# Patient Record
Sex: Female | Born: 1955
Health system: Southern US, Community
[De-identification: ages and names within clinical notes are randomized; demographics above are authoritative.]

## PROBLEM LIST (undated history)

## (undated) DIAGNOSIS — M1990 Unspecified osteoarthritis, unspecified site: Secondary | ICD-10-CM

## (undated) DIAGNOSIS — J189 Pneumonia, unspecified organism: Secondary | ICD-10-CM

## (undated) DIAGNOSIS — C50919 Malignant neoplasm of unspecified site of unspecified female breast: Secondary | ICD-10-CM

## (undated) DIAGNOSIS — I10 Essential (primary) hypertension: Secondary | ICD-10-CM

## (undated) DIAGNOSIS — I8001 Phlebitis and thrombophlebitis of superficial vessels of right lower extremity: Secondary | ICD-10-CM

## (undated) DIAGNOSIS — I872 Venous insufficiency (chronic) (peripheral): Secondary | ICD-10-CM

## (undated) DIAGNOSIS — I82409 Acute embolism and thrombosis of unspecified deep veins of unspecified lower extremity: Secondary | ICD-10-CM

## (undated) HISTORY — PX: OTHER SURGICAL HISTORY: SHX169

## (undated) HISTORY — PX: APPENDECTOMY: SHX54

## (undated) HISTORY — PX: MANDIBLE FRACTURE SURGERY: SHX706

---

## 1998-10-02 ENCOUNTER — Other Ambulatory Visit: Admission: RE | Admit: 1998-10-02 | Discharge: 1998-10-02 | Payer: Self-pay | Admitting: Obstetrics & Gynecology

## 2000-04-25 ENCOUNTER — Encounter (INDEPENDENT_AMBULATORY_CARE_PROVIDER_SITE_OTHER): Payer: Self-pay | Admitting: Specialist

## 2000-04-25 ENCOUNTER — Encounter: Payer: Self-pay | Admitting: *Deleted

## 2000-04-25 ENCOUNTER — Ambulatory Visit (HOSPITAL_COMMUNITY): Admission: RE | Admit: 2000-04-25 | Discharge: 2000-04-26 | Payer: Self-pay | Admitting: *Deleted

## 2000-04-26 ENCOUNTER — Observation Stay (HOSPITAL_COMMUNITY): Admission: EM | Admit: 2000-04-26 | Discharge: 2000-04-27 | Payer: Self-pay | Admitting: Emergency Medicine

## 2000-04-26 ENCOUNTER — Encounter: Payer: Self-pay | Admitting: Family Medicine

## 2002-06-11 ENCOUNTER — Encounter (INDEPENDENT_AMBULATORY_CARE_PROVIDER_SITE_OTHER): Payer: Self-pay | Admitting: Specialist

## 2002-06-11 ENCOUNTER — Encounter: Payer: Self-pay | Admitting: Obstetrics & Gynecology

## 2002-06-11 ENCOUNTER — Encounter: Admission: RE | Admit: 2002-06-11 | Discharge: 2002-06-11 | Payer: Self-pay | Admitting: Obstetrics & Gynecology

## 2002-06-13 ENCOUNTER — Ambulatory Visit (HOSPITAL_COMMUNITY): Admission: RE | Admit: 2002-06-13 | Discharge: 2002-06-13 | Payer: Self-pay | Admitting: Surgery

## 2002-06-13 ENCOUNTER — Encounter: Payer: Self-pay | Admitting: Surgery

## 2002-06-13 ENCOUNTER — Other Ambulatory Visit: Admission: RE | Admit: 2002-06-13 | Discharge: 2002-06-13 | Payer: Self-pay | Admitting: Obstetrics and Gynecology

## 2002-06-17 ENCOUNTER — Ambulatory Visit (HOSPITAL_COMMUNITY): Admission: RE | Admit: 2002-06-17 | Discharge: 2002-06-17 | Payer: Self-pay | Admitting: Surgery

## 2002-06-17 ENCOUNTER — Encounter: Payer: Self-pay | Admitting: Surgery

## 2002-06-19 ENCOUNTER — Encounter: Admission: RE | Admit: 2002-06-19 | Discharge: 2002-06-19 | Payer: Self-pay | Admitting: Surgery

## 2002-06-19 ENCOUNTER — Encounter: Payer: Self-pay | Admitting: Surgery

## 2002-06-21 ENCOUNTER — Encounter: Payer: Self-pay | Admitting: Surgery

## 2002-06-21 ENCOUNTER — Ambulatory Visit (HOSPITAL_BASED_OUTPATIENT_CLINIC_OR_DEPARTMENT_OTHER): Admission: RE | Admit: 2002-06-21 | Discharge: 2002-06-21 | Payer: Self-pay | Admitting: Surgery

## 2002-06-21 ENCOUNTER — Encounter: Admission: RE | Admit: 2002-06-21 | Discharge: 2002-06-21 | Payer: Self-pay | Admitting: Surgery

## 2002-06-21 ENCOUNTER — Encounter (INDEPENDENT_AMBULATORY_CARE_PROVIDER_SITE_OTHER): Payer: Self-pay | Admitting: *Deleted

## 2002-07-03 ENCOUNTER — Ambulatory Visit: Admission: RE | Admit: 2002-07-03 | Discharge: 2002-07-22 | Payer: Self-pay | Admitting: Radiation Oncology

## 2002-07-22 ENCOUNTER — Ambulatory Visit: Admission: RE | Admit: 2002-07-22 | Discharge: 2002-09-30 | Payer: Self-pay | Admitting: Radiation Oncology

## 2003-06-05 ENCOUNTER — Ambulatory Visit: Admission: RE | Admit: 2003-06-05 | Discharge: 2003-06-05 | Payer: Self-pay | Admitting: Radiation Oncology

## 2003-06-10 ENCOUNTER — Encounter: Admission: RE | Admit: 2003-06-10 | Discharge: 2003-06-10 | Payer: Self-pay | Admitting: Obstetrics & Gynecology

## 2003-06-12 ENCOUNTER — Ambulatory Visit: Admission: RE | Admit: 2003-06-12 | Discharge: 2003-06-12 | Payer: Self-pay | Admitting: Radiation Oncology

## 2004-11-01 ENCOUNTER — Encounter: Admission: RE | Admit: 2004-11-01 | Discharge: 2004-11-01 | Payer: Self-pay | Admitting: Surgery

## 2004-11-22 ENCOUNTER — Encounter: Admission: RE | Admit: 2004-11-22 | Discharge: 2004-11-22 | Payer: Self-pay | Admitting: Surgery

## 2005-05-16 ENCOUNTER — Other Ambulatory Visit: Admission: RE | Admit: 2005-05-16 | Discharge: 2005-05-16 | Payer: Self-pay | Admitting: Obstetrics & Gynecology

## 2006-02-07 ENCOUNTER — Encounter: Admission: RE | Admit: 2006-02-07 | Discharge: 2006-02-07 | Payer: Self-pay | Admitting: Family Medicine

## 2007-03-13 ENCOUNTER — Encounter: Admission: RE | Admit: 2007-03-13 | Discharge: 2007-03-13 | Payer: Self-pay | Admitting: Family Medicine

## 2008-08-30 ENCOUNTER — Emergency Department (HOSPITAL_COMMUNITY): Admission: EM | Admit: 2008-08-30 | Discharge: 2008-08-30 | Payer: Self-pay | Admitting: Emergency Medicine

## 2009-04-15 ENCOUNTER — Encounter: Admission: RE | Admit: 2009-04-15 | Discharge: 2009-04-15 | Payer: Self-pay | Admitting: Infectious Diseases

## 2009-10-01 ENCOUNTER — Emergency Department (HOSPITAL_COMMUNITY): Admission: EM | Admit: 2009-10-01 | Discharge: 2009-10-01 | Payer: Self-pay | Admitting: Emergency Medicine

## 2009-10-13 ENCOUNTER — Emergency Department (HOSPITAL_COMMUNITY): Admission: EM | Admit: 2009-10-13 | Discharge: 2009-10-14 | Payer: Self-pay | Admitting: Emergency Medicine

## 2010-01-01 ENCOUNTER — Ambulatory Visit (HOSPITAL_COMMUNITY): Admission: RE | Admit: 2010-01-01 | Discharge: 2010-01-01 | Payer: Self-pay | Admitting: Radiation Oncology

## 2010-05-30 ENCOUNTER — Encounter: Payer: Self-pay | Admitting: *Deleted

## 2010-05-30 ENCOUNTER — Encounter: Payer: Self-pay | Admitting: Surgery

## 2010-07-26 LAB — DIFFERENTIAL
Band Neutrophils: 0 % (ref 0–10)
Basophils Absolute: 0 10*3/uL (ref 0.0–0.1)
Basophils Relative: 0 % (ref 0–1)
Blasts: 0 %
Eosinophils Absolute: 0.3 10*3/uL (ref 0.0–0.7)
Eosinophils Relative: 2 % (ref 0–5)
Lymphocytes Relative: 43 % (ref 12–46)
Lymphs Abs: 5.8 10*3/uL — ABNORMAL HIGH (ref 0.7–4.0)
Metamyelocytes Relative: 0 %
Monocytes Absolute: 1.2 10*3/uL — ABNORMAL HIGH (ref 0.1–1.0)
Monocytes Relative: 9 % (ref 3–12)
Myelocytes: 0 %
Neutro Abs: 6.1 10*3/uL (ref 1.7–7.7)
Neutrophils Relative %: 46 % (ref 43–77)
Promyelocytes Absolute: 0 %
WBC Morphology: INCREASED
nRBC: 0 /100 WBC

## 2010-07-26 LAB — URINALYSIS, ROUTINE W REFLEX MICROSCOPIC
Glucose, UA: NEGATIVE mg/dL
Hgb urine dipstick: NEGATIVE
Ketones, ur: 40 mg/dL — AB
Nitrite: NEGATIVE
Protein, ur: 100 mg/dL — AB
Specific Gravity, Urine: 1.031 — ABNORMAL HIGH (ref 1.005–1.030)
Urobilinogen, UA: 1 mg/dL (ref 0.0–1.0)
pH: 6 (ref 5.0–8.0)

## 2010-07-26 LAB — POCT I-STAT, CHEM 8
BUN: 13 mg/dL (ref 6–23)
Calcium, Ion: 1.06 mmol/L — ABNORMAL LOW (ref 1.12–1.32)
Chloride: 103 mEq/L (ref 96–112)
Creatinine, Ser: 0.9 mg/dL (ref 0.4–1.2)
Glucose, Bld: 100 mg/dL — ABNORMAL HIGH (ref 70–99)
HCT: 39 % (ref 36.0–46.0)
Hemoglobin: 13.3 g/dL (ref 12.0–15.0)
Potassium: 4.4 mEq/L (ref 3.5–5.1)
Sodium: 136 mEq/L (ref 135–145)
TCO2: 28 mmol/L (ref 0–100)

## 2010-07-26 LAB — LIPASE, BLOOD: Lipase: 15 U/L (ref 11–59)

## 2010-07-26 LAB — URINE MICROSCOPIC-ADD ON

## 2010-07-26 LAB — COMPREHENSIVE METABOLIC PANEL
ALT: 23 U/L (ref 0–35)
AST: 32 U/L (ref 0–37)
Albumin: 3.2 g/dL — ABNORMAL LOW (ref 3.5–5.2)
Alkaline Phosphatase: 82 U/L (ref 39–117)
BUN: 10 mg/dL (ref 6–23)
CO2: 23 mEq/L (ref 19–32)
Calcium: 8.3 mg/dL — ABNORMAL LOW (ref 8.4–10.5)
Chloride: 94 mEq/L — ABNORMAL LOW (ref 96–112)
Creatinine, Ser: 1.01 mg/dL (ref 0.4–1.2)
GFR calc Af Amer: >60 mL/min (ref >60)
GFR calc non Af Amer: 57 mL/min — ABNORMAL LOW (ref >60)
Glucose, Bld: 127 mg/dL — ABNORMAL HIGH (ref 70–99)
Potassium: 3.4 mEq/L — ABNORMAL LOW (ref 3.5–5.1)
Sodium: 126 mEq/L — ABNORMAL LOW (ref 135–145)
Total Bilirubin: 0.9 mg/dL (ref 0.3–1.2)
Total Protein: 6.8 g/dL (ref 6.0–8.3)

## 2010-07-26 LAB — CBC
HCT: 37.4 % (ref 36.0–46.0)
Hemoglobin: 13.1 g/dL (ref 12.0–15.0)
MCHC: 35 g/dL (ref 30.0–36.0)
MCV: 89.5 fL (ref 78.0–100.0)
Platelets: 193 10*3/uL (ref 150–400)
RBC: 4.18 MIL/uL (ref 3.87–5.11)
RDW: 14.4 % (ref 11.5–15.5)
WBC: 13.4 10*3/uL — ABNORMAL HIGH (ref 4.0–10.5)

## 2010-07-26 LAB — CULTURE, BLOOD (ROUTINE X 2)
Culture: NO GROWTH
Culture: NO GROWTH

## 2010-07-26 LAB — D-DIMER, QUANTITATIVE: D-Dimer, Quant: 1.1 ug/mL-FEU — ABNORMAL HIGH (ref 0.00–0.48)

## 2010-09-24 NOTE — Op Note (Signed)
NAME:  Virginia Morgan, Virginia Morgan                          ACCOUNT NO.:  [id number]   MEDICAL RECORD NO.:  [id number]                   PATIENT TYPE:  AMB   LOCATION:  DSC                                  FACILITY:  [facility]   PHYSICIAN:  Currie Paris, M.D.           DATE OF BIRTH:  1955/11/23   DATE OF PROCEDURE:  06/21/2002  DATE OF DISCHARGE:                                 OPERATIVE REPORT   OFFICE MEDICAL RECORD NUMBER:  [id number]   PREOPERATIVE DIAGNOSIS:  Ductal carcinoma in situ, left breast, upper-inner  quadrant.   POSTOPERATIVE DIAGNOSIS:  Ductal carcinoma in situ, left breast, upper-inner  quadrant.   OPERATION:  Needle-guided excision of left breast cancer.   SURGEON:  Currie Paris, M.D.   ANESTHESIA:  General.   CLINICAL HISTORY:  This patient is a generally healthy 55 year old who has  recently had a mammogram showing an area of calcifications.  Core biopsy has  shown DCIS.  Workup otherwise was negative.   DESCRIPTION OF PROCEDURE:  The patient was seen in the holding area and had  no further questions.  She was taken into the operating room and after  satisfactory general anesthesia had been obtained, the left breast was  prepped and draped.  The guidewire entered medially and tracked fairly  directly posteriorly but a little bit from medial to lateral.   I made a direct transverse incision, since the guidewire was located almost  in the 9 o'clock position.  I divided just a little bit of subcutaneous  tissue and then took a wide excision around the guidewire using cautery.  Most of this tissue just appeared to be fatty tissue.  I felt I was  completely around the guidewire and sent that for specimen mammography.  However, I could not really tell where my margins were in relation to any of  this, so I went ahead and  used the cautery at this point and took another  centimeter of margin circumferentially around the biopsy cavity.  Once this  was done, I  marked out for orientation and then proceeded to close the  incision.  Prior to closing, we made sure everything was dry; I used the  cautery for that.  I injected Marcaine in the skin at the start and some  into the breast tissues as we proceeded and a little bit left in the cavity  at the end to help with postoperative pain relief.  I put four small clips  to mark the periphery of the incision of the lumpectomy and a larger clip at  the deep and superficial margins of the lumpectomy site.   There really was not much of a cavity here and because of so much fatty  tissue, it tended to fall together, but I did use a few Vicryls to  reapproximate the dead space and I then closed the skin with 4-0 Monocryl  subcuticular plus Steri-Strips.  The patient tolerated the procedure well.  There were no operative complications and all counts were correct.                                               Currie Paris, M.D.    CJS/MEDQ  D:  06/21/2002  T:  06/21/2002  Job:  [id number]   cc:   Freddy Finner, M.D.  [address] [place]  [place] [address]  Fax: [phone]

## 2010-09-24 NOTE — Op Note (Signed)
[facility]  Patient:    Virginia Morgan, Virginia Morgan                       MRN: [id number] Proc. Date: 04/26/00 Adm. Date:  [id number] Disc. Date: [id number] Attending:  Meredith Leeds                           Operative Report  PREOPERATIVE DIAGNOSIS:  Acute appendicitis.  POSTOPERATIVE DIAGNOSIS:  Acute appendicitis.  OPERATION:  Laparoscopic appendectomy.  SURGEON:  Zigmund Daniel, M.D.  ANESTHESIA: General.  DESCRIPTION OF PROCEDURE:  After adequate monitoring and anesthesia and insertion of a Foley catheter and  routine preparation and draping of the abdomen, I made a short transverse infraumbilical incision, cutting out a soft skin tag which was in the way.  I dissected down through the fat, to the midline fascia and opened it in the midline, then bluntly opened the peritoneum and assured good access to the abdominal cavity.  After placement of a 0 Vicryl pursestring and securing a Hasson cannula, I inflated the abdomen with CO2 and inserted the scope.  There was inflammation at the tip of the cecum.  There were adhesions of the distal ileum and cecum to the lateral pelvic and abdominal wall, making exposure of the appendix difficult.  The patients large amount of abdominal fat also made the exposure somewhat difficult.  I cut adhesions using cautery and scissors and was able to then grasp the appendix near the tip.  It was more or less inflamed throughout, and the mesentery was quite thick.  I dissected the mesentery, and in the dissection felt that I encountered and cauterized the appendiceal artery without every seeing it specifically.  However, I dissected down until there was really very little tissue left in the mesentery, and the appendix was dissected down to the base of the cecum, and it looked healthy at that point. I stapled it with a endoscopic stapler 35 mm in height, and it appeared to provide secure closure and amputation.  I then  placed the appendix in a plastic pouch.  I thoroughly inspected the operative area and found that there were no signs of bleeding.  I irrigated the area and removed the irrigant.   I had not seen any significant free fluid or pus at the time of exploration.  I removed the appendix from the body through the umbilical incision and then tied the pursestring suture under direct vision.  I anesthetized all the incisions and removed the other ports after removing the CO2.  I closed the skin of all incisions with intracuticular 4-0 Vicryl and Steri-Strips.  The patient tolerated the operation well. DD:  04/26/00 TD:  04/27/00 Job: [id number]

## 2011-02-25 ENCOUNTER — Other Ambulatory Visit (HOSPITAL_COMMUNITY): Payer: Self-pay | Admitting: Obstetrics & Gynecology

## 2011-02-25 DIAGNOSIS — Z1231 Encounter for screening mammogram for malignant neoplasm of breast: Secondary | ICD-10-CM

## 2011-03-21 ENCOUNTER — Ambulatory Visit (HOSPITAL_COMMUNITY)
Admission: RE | Admit: 2011-03-21 | Discharge: 2011-03-21 | Disposition: A | Discharge status: Home / Self Care | Payer: Self-pay | Source: Ambulatory Visit | Attending: Obstetrics & Gynecology | Admitting: Obstetrics & Gynecology

## 2011-03-21 DIAGNOSIS — Z1231 Encounter for screening mammogram for malignant neoplasm of breast: Secondary | ICD-10-CM

## 2011-12-27 ENCOUNTER — Emergency Department (HOSPITAL_COMMUNITY): Payer: Self-pay

## 2011-12-27 ENCOUNTER — Encounter (HOSPITAL_COMMUNITY): Payer: Self-pay | Admitting: *Deleted

## 2011-12-27 ENCOUNTER — Inpatient Hospital Stay (HOSPITAL_COMMUNITY)
Admission: EM | Admit: 2011-12-27 | Discharge: 2011-12-28 | DRG: 202 | Disposition: A | Discharge status: Home / Self Care | Payer: MEDICAID | Attending: Internal Medicine | Admitting: Internal Medicine

## 2011-12-27 DIAGNOSIS — R0902 Hypoxemia: Secondary | ICD-10-CM | POA: Diagnosis present

## 2011-12-27 DIAGNOSIS — Z6841 Body Mass Index (BMI) 40.0 and over, adult: Secondary | ICD-10-CM

## 2011-12-27 DIAGNOSIS — R0602 Shortness of breath: Secondary | ICD-10-CM

## 2011-12-27 DIAGNOSIS — M79609 Pain in unspecified limb: Secondary | ICD-10-CM

## 2011-12-27 DIAGNOSIS — Z853 Personal history of malignant neoplasm of breast: Secondary | ICD-10-CM

## 2011-12-27 DIAGNOSIS — J209 Acute bronchitis, unspecified: Principal | ICD-10-CM | POA: Diagnosis present

## 2011-12-27 DIAGNOSIS — R609 Edema, unspecified: Secondary | ICD-10-CM | POA: Diagnosis present

## 2011-12-27 DIAGNOSIS — R0600 Dyspnea, unspecified: Secondary | ICD-10-CM | POA: Diagnosis present

## 2011-12-27 DIAGNOSIS — R000 Tachycardia, unspecified: Secondary | ICD-10-CM | POA: Diagnosis present

## 2011-12-27 HISTORY — DX: Pneumonia, unspecified organism: J18.9

## 2011-12-27 HISTORY — DX: Malignant neoplasm of unspecified site of unspecified female breast: C50.919

## 2011-12-27 LAB — BLOOD GAS, ARTERIAL
Acid-base deficit: 1 mmol/L (ref 0.0–2.0)
Bicarbonate: 22.4 mEq/L (ref 20.0–24.0)
Drawn by: 103701
FIO2: 0.21 %
O2 Saturation: 94.9 %
Patient temperature: 98.6
TCO2: 19.8 mmol/L (ref 0–100)
pCO2 arterial: 34.9 mmHg — ABNORMAL LOW (ref 35.0–45.0)
pH, Arterial: 7.423 (ref 7.350–7.450)
pO2, Arterial: 71.7 mmHg — ABNORMAL LOW (ref 80.0–100.0)

## 2011-12-27 LAB — CREATININE, SERUM
Creatinine, Ser: 0.77 mg/dL (ref 0.50–1.10)
GFR calc Af Amer: >90 mL/min (ref >90)
GFR calc non Af Amer: >90 mL/min (ref >90)

## 2011-12-27 LAB — BASIC METABOLIC PANEL
BUN: 10 mg/dL (ref 6–23)
CO2: 27 mEq/L (ref 19–32)
Calcium: 9.1 mg/dL (ref 8.4–10.5)
Chloride: 103 mEq/L (ref 96–112)
Creatinine, Ser: 0.75 mg/dL (ref 0.50–1.10)
GFR calc Af Amer: >90 mL/min (ref >90)
GFR calc non Af Amer: >90 mL/min (ref >90)
Glucose, Bld: 115 mg/dL — ABNORMAL HIGH (ref 70–99)
Potassium: 4 mEq/L (ref 3.5–5.1)
Sodium: 136 mEq/L (ref 135–145)

## 2011-12-27 LAB — CBC
HCT: 37.7 % (ref 36.0–46.0)
HCT: 39.8 % (ref 36.0–46.0)
Hemoglobin: 12.1 g/dL (ref 12.0–15.0)
Hemoglobin: 12.7 g/dL (ref 12.0–15.0)
MCH: 29.8 pg (ref 26.0–34.0)
MCH: 29.7 pg (ref 26.0–34.0)
MCHC: 32.1 g/dL (ref 30.0–36.0)
MCHC: 31.9 g/dL (ref 30.0–36.0)
MCV: 92.9 fL (ref 78.0–100.0)
MCV: 93 fL (ref 78.0–100.0)
Platelets: 243 10*3/uL (ref 150–400)
Platelets: 266 10*3/uL (ref 150–400)
RBC: 4.06 MIL/uL (ref 3.87–5.11)
RBC: 4.28 MIL/uL (ref 3.87–5.11)
RDW: 13.8 % (ref 11.5–15.5)
RDW: 14.1 % (ref 11.5–15.5)
WBC: 7.5 10*3/uL (ref 4.0–10.5)
WBC: 8.2 10*3/uL (ref 4.0–10.5)

## 2011-12-27 LAB — HEPATIC FUNCTION PANEL
ALT: 14 U/L (ref 0–35)
AST: 19 U/L (ref 0–37)
Albumin: 3.8 g/dL (ref 3.5–5.2)
Alkaline Phosphatase: 77 U/L (ref 39–117)
Bilirubin, Direct: <0.1 mg/dL (ref 0.0–0.3)
Total Bilirubin: 0.3 mg/dL (ref 0.3–1.2)
Total Protein: 7.1 g/dL (ref 6.0–8.3)

## 2011-12-27 LAB — CARDIAC PANEL(CRET KIN+CKTOT+MB+TROPI)
CK, MB: 4.1 ng/mL — ABNORMAL HIGH (ref 0.3–4.0)
CK, MB: 5.3 ng/mL — ABNORMAL HIGH (ref 0.3–4.0)
Relative Index: 2.2 (ref 0.0–2.5)
Relative Index: 2.2 (ref 0.0–2.5)
Total CK: 183 U/L — ABNORMAL HIGH (ref 7–177)
Total CK: 246 U/L — ABNORMAL HIGH (ref 7–177)
Troponin I: <0.3 ng/mL (ref <0.30)
Troponin I: <0.3 ng/mL (ref <0.30)

## 2011-12-27 LAB — HEMOGLOBIN A1C
Hgb A1c MFr Bld: 5.4 % (ref <5.7)
Mean Plasma Glucose: 108 mg/dL (ref <117)

## 2011-12-27 LAB — D-DIMER, QUANTITATIVE: D-Dimer, Quant: 0.79 ug/mL-FEU — ABNORMAL HIGH (ref 0.00–0.48)

## 2011-12-27 LAB — TSH: TSH: 1.688 u[IU]/mL (ref 0.350–4.500)

## 2011-12-27 LAB — MAGNESIUM: Magnesium: 2 mg/dL (ref 1.5–2.5)

## 2011-12-27 LAB — PRO B NATRIURETIC PEPTIDE: Pro B Natriuretic peptide (BNP): 44.5 pg/mL (ref 0–125)

## 2011-12-27 MED ORDER — SODIUM CHLORIDE 0.9 % IJ SOLN: 3.0000 mL | INTRAMUSCULAR | Status: DC | PRN

## 2011-12-27 MED ORDER — SODIUM CHLORIDE 0.9 % IV SOLN: INTRAVENOUS | Status: DC

## 2011-12-27 MED ORDER — SODIUM CHLORIDE 0.9 % IJ SOLN
3.0000 mL | Freq: Two times a day (BID) | INTRAMUSCULAR | Status: DC
  Administered 2011-12-27: 3 mL via INTRAVENOUS

## 2011-12-27 MED ORDER — ONDANSETRON HCL 4 MG/2ML IJ SOLN: 4.0000 mg | Freq: Four times a day (QID) | INTRAMUSCULAR | Status: DC | PRN

## 2011-12-27 MED ORDER — SODIUM CHLORIDE 0.9 % IJ SOLN
3.0000 mL | Freq: Two times a day (BID) | INTRAMUSCULAR | Status: DC
  Administered 2011-12-27 – 2011-12-28 (×2): 3 mL via INTRAVENOUS

## 2011-12-27 MED ORDER — ACETAMINOPHEN 650 MG RE SUPP: 650.0000 mg | Freq: Four times a day (QID) | RECTAL | Status: DC | PRN

## 2011-12-27 MED ORDER — IPRATROPIUM BROMIDE 0.02 % IN SOLN
0.5000 mg | Freq: Once | RESPIRATORY_TRACT | Status: AC
  Administered 2011-12-27: 0.5 mg via RESPIRATORY_TRACT
  Filled 2011-12-27: qty 2.5

## 2011-12-27 MED ORDER — ACETAMINOPHEN 325 MG PO TABS
650.0000 mg | ORAL_TABLET | Freq: Four times a day (QID) | ORAL | Status: DC | PRN
  Administered 2011-12-27: 650 mg via ORAL
  Filled 2011-12-27: qty 2

## 2011-12-27 MED ORDER — METHYLPREDNISOLONE SODIUM SUCC 125 MG IJ SOLR
125.0000 mg | Freq: Once | INTRAMUSCULAR | Status: AC
  Administered 2011-12-27: 125 mg via INTRAVENOUS
  Filled 2011-12-27: qty 2

## 2011-12-27 MED ORDER — ONDANSETRON HCL 4 MG PO TABS: 4.0000 mg | ORAL_TABLET | Freq: Four times a day (QID) | ORAL | Status: DC | PRN

## 2011-12-27 MED ORDER — LORAZEPAM 2 MG/ML IJ SOLN
1.0000 mg | Freq: Once | INTRAMUSCULAR | Status: AC
  Administered 2011-12-27: 10:00:00 via INTRAVENOUS
  Filled 2011-12-27: qty 1

## 2011-12-27 MED ORDER — IBUPROFEN 600 MG PO TABS
600.0000 mg | ORAL_TABLET | Freq: Three times a day (TID) | ORAL | Status: DC | PRN
  Administered 2011-12-28: 600 mg via ORAL
  Filled 2011-12-27: qty 1

## 2011-12-27 MED ORDER — SODIUM CHLORIDE 0.9 % IV SOLN: 250.0000 mL | INTRAVENOUS | Status: DC | PRN

## 2011-12-27 MED ORDER — ALBUTEROL SULFATE (5 MG/ML) 0.5% IN NEBU
5.0000 mg | INHALATION_SOLUTION | Freq: Once | RESPIRATORY_TRACT | Status: AC
  Administered 2011-12-27: 5 mg via RESPIRATORY_TRACT
  Filled 2011-12-27: qty 1

## 2011-12-27 MED ORDER — ALBUTEROL SULFATE (5 MG/ML) 0.5% IN NEBU: 2.5000 mg | INHALATION_SOLUTION | RESPIRATORY_TRACT | Status: DC

## 2011-12-27 MED ORDER — CITALOPRAM HYDROBROMIDE 40 MG PO TABS
40.0000 mg | ORAL_TABLET | Freq: Every day | ORAL | Status: DC
  Administered 2011-12-28: 40 mg via ORAL
  Filled 2011-12-27 (×2): qty 1

## 2011-12-27 MED ORDER — ASPIRIN EC 81 MG PO TBEC
81.0000 mg | DELAYED_RELEASE_TABLET | Freq: Every day | ORAL | Status: DC
  Administered 2011-12-28: 81 mg via ORAL
  Filled 2011-12-27 (×2): qty 1

## 2011-12-27 MED ORDER — ENOXAPARIN SODIUM 40 MG/0.4ML ~~LOC~~ SOLN
40.0000 mg | SUBCUTANEOUS | Status: DC
  Administered 2011-12-27: 40 mg via SUBCUTANEOUS
  Filled 2011-12-27 (×2): qty 0.4

## 2011-12-27 MED ORDER — LEVALBUTEROL HCL 0.63 MG/3ML IN NEBU
0.6300 mg | INHALATION_SOLUTION | Freq: Four times a day (QID) | RESPIRATORY_TRACT | Status: DC
Start: 2011-12-27 — End: 2011-12-28
  Administered 2011-12-27 (×2): 0.63 mg via RESPIRATORY_TRACT
  Filled 2011-12-27 (×4): qty 3

## 2011-12-27 MED ORDER — IPRATROPIUM BROMIDE 0.02 % IN SOLN
0.5000 mg | Freq: Four times a day (QID) | RESPIRATORY_TRACT | Status: DC
  Administered 2011-12-27 (×2): 0.5 mg via RESPIRATORY_TRACT
  Filled 2011-12-27 (×2): qty 2.5

## 2011-12-27 MED ORDER — IOHEXOL 300 MG/ML  SOLN
100.0000 mL | Freq: Once | INTRAMUSCULAR | Status: AC | PRN
  Administered 2011-12-27: 100 mL via INTRAVENOUS

## 2011-12-27 MED ORDER — BUPROPION HCL ER (XL) 300 MG PO TB24
300.0000 mg | ORAL_TABLET | Freq: Every day | ORAL | Status: DC
  Administered 2011-12-28: 300 mg via ORAL
  Filled 2011-12-27 (×2): qty 1

## 2011-12-27 MED ORDER — METHYLPREDNISOLONE SODIUM SUCC 125 MG IJ SOLR
60.0000 mg | Freq: Two times a day (BID) | INTRAMUSCULAR | Status: DC
  Administered 2011-12-27 – 2011-12-28 (×2): 60 mg via INTRAVENOUS
  Filled 2011-12-27 (×3): qty 0.96

## 2011-12-27 MED ORDER — PREDNISONE 20 MG PO TABS
60.0000 mg | ORAL_TABLET | Freq: Once | ORAL | Status: AC
  Administered 2011-12-27: 60 mg via ORAL
  Filled 2011-12-27: qty 3

## 2011-12-27 MED ORDER — ALBUTEROL (5 MG/ML) CONTINUOUS INHALATION SOLN
10.0000 mg/h | INHALATION_SOLUTION | RESPIRATORY_TRACT | Status: AC
  Administered 2011-12-27: 10 mg/h via RESPIRATORY_TRACT

## 2011-12-27 NOTE — ED Notes (Signed)
Unable to obtain blood for labs due to patient having venous duplex.will obtain labs when duplex complete

## 2011-12-27 NOTE — Plan of Care (Signed)
Problem: Food- and Nutrition-Related Knowledge Deficit (NB-1.1) Goal: Nutrition education Formal process to instruct or train a patient/client in a skill or to impart knowledge to help patients/clients voluntarily manage or modify food choices and eating behavior to maintain or improve health.  Outcome: Completed/Met Date Met:  12/27/11 Knowledge deficit resolved 8/20 with diet education.   Comments:  Discussed and provided handouts obtained from ADA nutrition care manual for healthful nutrition and healthful vegetarian nutrition. I have also educated the patient on food label reading, low cholesterol, low fat and low sodium diet. The patient was eager to listen to RD. The patient was without any nutrition related questions or concerns. Patient verbalized understanding. Expect fair compliance.   RD available for nutrition needs.   Virginia Morgan [facility] [phone]

## 2011-12-27 NOTE — ED Notes (Signed)
PA at bedside.

## 2011-12-27 NOTE — ED Notes (Signed)
Patient transported to CT 

## 2011-12-27 NOTE — ED Provider Notes (Addendum)
History     CSN: [id number]  Arrival date & time 12/27/11  0224   First MD Initiated Contact with Patient 12/27/11 [phone]      Chief Complaint  Patient presents with  . Shortness of Breath    (Consider location/radiation/quality/duration/timing/severity/associated sxs/prior treatment) HPI Comments: Patient is an obese 56 year old female with a history of breast cancer that presents emergency department with chief complaint of shortness of breath.  Symptoms have been gradually worsening over the last week and associated with wheezing and intermittent chest tightness, cough, orthopnea, and bilateral leg swelling.  She reports she's been sleeping in her recliner for the last 2 nights and believes that this is the cause of her lower extremity swelling.  Patient states she had a similar presentation 3 years ago when she was diagnosed with pneumonia.  She denies any chest pain, palpitations, syncope, fever, night sweats, chills, hemoptysis, calf pain, claudication, fever, night sweats or chills.  Patient has not had any recent surgery, travel, and is not on hormone replacement therapy.  Patient is a 56 y.o. female presenting with shortness of breath. The history is provided by the patient.  Shortness of Breath  Associated symptoms include cough, shortness of breath and wheezing. Pertinent negatives include no chest pain, no fever and no stridor.    Past Medical History  Diagnosis Date  . Breast cancer   . Pneumonia     Past Surgical History  Procedure Date  . Appendectomy     No family history on file.  History  Substance Use Topics  . Smoking status: Never Smoker   . Smokeless tobacco: Not on file  . Alcohol Use:     OB History    Grav Para Term Preterm Abortions TAB SAB Ect Mult Living                  Review of Systems  Constitutional: Negative for fever, chills, diaphoresis, activity change and appetite change.  HENT: Negative for congestion and neck pain.   Eyes:  Negative for visual disturbance.  Respiratory: Positive for cough, chest tightness, shortness of breath and wheezing. Negative for apnea, choking and stridor.   Cardiovascular: Positive for leg swelling. Negative for chest pain and palpitations.  Gastrointestinal: Negative for nausea and abdominal pain.  Genitourinary: Negative for dysuria, urgency and frequency.  Musculoskeletal: Negative for myalgias.  Skin: Negative for color change and wound.  Neurological: Negative for dizziness, syncope, weakness, light-headedness, numbness and headaches.  Psychiatric/Behavioral: Negative for confusion.  All other systems reviewed and are negative.    Allergies  Review of patient's allergies indicates no known allergies.  Home Medications   Current Outpatient Rx  Name Route Sig Dispense Refill  . AMLODIPINE BESYLATE 10 MG PO TABS Oral Take 10 mg by mouth daily.    . BUPROPION HCL ER (XL) 300 MG PO TB24 Oral Take 300 mg by mouth daily.    Marland Kitchen CITALOPRAM HYDROBROMIDE 40 MG PO TABS Oral Take 40 mg by mouth daily.      BP 111/60  Pulse 71  Temp 98.9 F (37.2 C) (Oral)  Resp 18  SpO2 99%  Physical Exam  Constitutional: She is oriented to person, place, and time. She appears well-developed and well-nourished. No distress.  HENT:  Head: Normocephalic and atraumatic.       Uvula midline, oropharynx clear and moist  Eyes: Conjunctivae and EOM are normal. Pupils are equal, round, and reactive to light.  Neck: Normal range of motion.  No stridor.  Neck supple with full range of motion.  Cardiovascular: Normal rate, regular rhythm, normal heart sounds and intact distal pulses.   Pulmonary/Chest: Effort normal. She has wheezes (expiratory). She exhibits tenderness.       Patient not in respiratory distress, no accessory muscle use, nasal flaring.  Patient able to speak in full sentences.  Airway intact.  Lung auscultation positive for bilateral expiratory wheezing  Abdominal: Soft. There is no  tenderness.  Musculoskeletal: Normal range of motion. She exhibits no edema and no tenderness.  Neurological: She is alert and oriented to person, place, and time.  Skin: Skin is warm and dry. No rash noted. She is not diaphoretic.  Psychiatric: She has a normal mood and affect. Her behavior is normal.    ED Course  Procedures (including critical care time)  Labs Reviewed  BASIC METABOLIC PANEL - Abnormal; Notable for the following:    Glucose, Bld 115 (*)     All other components within normal limits  D-DIMER, QUANTITATIVE - Abnormal; Notable for the following:    D-Dimer, Quant 0.79 (*)     All other components within normal limits  CBC  PRO B NATRIURETIC PEPTIDE   Dg Chest 2 View  12/27/2011  *RADIOLOGY REPORT*  Clinical Data: Breath for 72 hours.  CHEST - 2 VIEW  Comparison: 10/13/2009  Findings: Slightly shallow inspiration. The heart size and pulmonary vascularity are normal. The lungs appear clear and expanded without focal air space disease or consolidation. No blunting of the costophrenic angles.  No pneumothorax.  Mediastinal contours appear intact.  No significant changes since the previous study.  IMPRESSION: No evidence of active pulmonary disease.   Original Report Authenticated By: Marlon Pel, M.D.      Date: 12/27/2011  Rate: 78  Rhythm: normal sinus rhythm  QRS Axis: normal  Intervals: normal  ST/T Wave abnormalities: abnormal t wave changes Conduction Disutrbances: none  Narrative Interpretation:   Old EKG Reviewed: No significant changes noted    No diagnosis found.  PF testing per respiratory: Pre 290/ Post 300   MDM  Shortness of breath and cough  Patient is a 56 year old obese female with a history of breast cancer that presents to the emergency department with shortness of breath and cough that has been gradually worsening over the last week.  Plan is to measure peak flow volume prior to and after nebulizer treatment and rule out pulmonary  embolism.  10:28 AM No evidence of PE on CTangio. Nurse to ambulate pt. If can tolerate ambulation will dc with pulm follow up, pred taper, and albuterol inhaler. No current signs of respiratory distress. Lung exam improved after hospital treatment.   10:55 AM Pt ambulated with pulse ox dropping to 87% and HR increasing to 144. Accessory muscle use & tachypnea evident for respiratory distress after ambulation. Will admit to [facility] for observation.        Jaci Carrel, PA-C 12/27/11 [address] [place] 02/16/12 0121

## 2011-12-27 NOTE — Progress Notes (Signed)
2D Echocardiogram has been performed.  Virginia Morgan 12/27/2011, 2:34 PM

## 2011-12-27 NOTE — ED Notes (Signed)
Venous Duplex being performed, will transfer pt upon completion of test.

## 2011-12-27 NOTE — ED Notes (Signed)
Pt presents with increasing shortness of breath x 1 wk; wheezing on arrival; initial sats 93%; able to speak in full sentences; denies pain or fever; c/o chest congestion

## 2011-12-27 NOTE — Progress Notes (Signed)
Virginia Morgan, is a 56 y.o. female,   MRN: [id number]  -  DOB - 10-28-1955  Outpatient Primary MD for the patient is No primary provider on file.  in for    Chief Complaint  Patient presents with  . Shortness of Breath     Blood pressure 112/57, pulse 117, temperature 98.9 F (37.2 C), temperature source Oral, resp. rate 28, SpO2 96.00%.  Principal Problem:  *Hypoxia Active Problems:  Dyspnea  Tachycardia   56 yo morbidly obese female hx breast ca presents with progressive, worsening SOB for last 6 days. Denies CP, palpitation, fever, chill. Symptoms worsened over last 2 days and dry cough with wheeze developed. She reports sleeping in recliner and some increase in LEE.   In ED CT angio no evidence of PE and chest xray no evidence of pulmonary disease. Sats 93% on room air at rest. Upon ambulation sat dropped 87% HR up to 140. Remain tachy at 115 on my exam. Also moderate increased work of breathing with conversation, expiratory wheezes on auscultation with dry tight cough.  Received breathing treatment in ED with little relief. Will admit to tele

## 2011-12-27 NOTE — ED Provider Notes (Signed)
Medical screening examination/treatment/procedure(s) were performed by non-physician practitioner and as supervising physician I was immediately available for consultation/collaboration.   Lyanne Co, MD 12/27/11 1124

## 2011-12-27 NOTE — ED Notes (Signed)
Attempted to call report to [phone] but RN unavailable, will call back.

## 2011-12-27 NOTE — ED Notes (Signed)
RT aware of breathing tx.

## 2011-12-27 NOTE — H&P (Signed)
[facility] History and Physical  Virginia Morgan:[id number] DOB: March 02, 1956 DOA: 12/27/2011  Referring physician: Lyanne Co, MD PCP: No primary provider on file.   Chief Complaint: Shortness of Breath    HPI:  56 year old female with a history of breast cancer that presents emergency department with chief complaint of shortness of breath. Symptoms have been gradually worsening over the last week and associated with wheezing and intermittent chest tightness, cough, orthopnea, and bilateral leg swelling. She reports she's been sleeping in her recliner for the last 2 nights and believes that this is the cause of her lower extremity swelling. Patient states she had a similar presentation 3 years ago when she was diagnosed with pneumonia. She denies any chest pain, palpitations, syncope, fever, night sweats, chills, hemoptysis, calf pain, claudication, fever, night sweats or chills. Patient has not had any recent surgery, travel, and is not on hormone replacement therapy.       Review of Systems: negative for the following  Constitutional: Negative for fever, chills, diaphoresis, activity change and appetite change.  HENT: Negative for congestion and neck pain.  Eyes: Negative for visual disturbance.  Respiratory: Positive for cough, chest tightness, shortness of breath and wheezing. Negative for apnea, choking and stridor.  Cardiovascular: Positive for leg swelling. Negative for chest pain and palpitations.  Gastrointestinal: Negative for nausea and abdominal pain.  Genitourinary: Negative for dysuria, urgency and frequency.  Musculoskeletal: Negative for myalgias.  Skin: Negative for color change and wound.  Neurological: Negative for dizziness, syncope, weakness, light-headedness, numbness and headaches.  Psychiatric/Behavioral: Negative for confusion.  All other systems reviewed and are negative.      Past Medical History  Diagnosis Date  . Breast cancer   .  Pneumonia      Past Surgical History  Procedure Date  . Appendectomy       Social History:  reports that she has never smoked. She does not have any smokeless tobacco history on file. Her alcohol and drug histories not on file.    No Known Allergies   Family history negative for coronary artery disease/diabetes  Prior to Admission medications   Medication Sig Start Date End Date Taking? Authorizing Provider  amLODipine (NORVASC) 10 MG tablet Take 10 mg by mouth daily.   Yes Historical Provider, MD  buPROPion (WELLBUTRIN XL) 300 MG 24 hr tablet Take 300 mg by mouth daily.   Yes Historical Provider, MD  citalopram (CELEXA) 40 MG tablet Take 40 mg by mouth daily.   Yes Historical Provider, MD     Physical Exam: Filed Vitals:   12/27/11 1042 12/27/11 1045 12/27/11 1149 12/27/11 1159  BP:    100/58  Pulse: 140 117  105  Temp:      TempSrc:      Resp:  28  20  SpO2: 87% 96% 96% 100%   Constitutional: She is oriented to person, place, and time. She appears well-developed and well-nourished. No distress.  HENT:  Head: Normocephalic and atraumatic.  Uvula midline, oropharynx clear and moist  Eyes: Conjunctivae and EOM are normal. Pupils are equal, round, and reactive to light.  Neck: Normal range of motion.  No stridor. Neck supple with full range of motion.  Cardiovascular: Normal rate, regular rhythm, normal heart sounds and intact distal pulses.  Pulmonary/Chest: Effort normal. She has wheezes (expiratory). She exhibits tenderness.  Patient not in respiratory distress, no accessory muscle use, nasal flaring. Patient able to speak in full sentences. Airway intact. Lung auscultation positive for  bilateral expiratory wheezing  Abdominal: Soft. There is no tenderness.  Musculoskeletal: Normal range of motion. She exhibits no edema and no tenderness.  Neurological: She is alert and oriented to person, place, and time.  Skin: Skin is warm and dry. No rash noted. She is not  diaphoretic.  Psychiatric: She has a normal mood and affect. Her behavior is normal      Labs on Admission:    Basic Metabolic Panel:  Lab 12/27/11 1610  NA 136  K 4.0  CL 103  CO2 27  GLUCOSE 115*  BUN 10  CREATININE 0.75  CALCIUM 9.1  MG --  PHOS --   Liver Function Tests: No results found for this basename: AST:5,ALT:5,ALKPHOS:5,BILITOT:5,PROT:5,ALBUMIN:5 in the last 168 hours No results found for this basename: LIPASE:5,AMYLASE:5 in the last 168 hours No results found for this basename: AMMONIA:5 in the last 168 hours CBC:  Lab 12/27/11 1222 12/27/11 0610  WBC 8.2 7.5  NEUTROABS -- --  HGB 12.7 12.1  HCT 39.8 37.7  MCV 93.0 92.9  PLT 266 243   Cardiac Enzymes: No results found for this basename: CKTOTAL:5,CKMB:5,CKMBINDEX:5,TROPONINI:5 in the last 168 hours  BNP (last 3 results)  Basename 12/27/11 0610  PROBNP 44.5      CBG: No results found for this basename: GLUCAP:5 in the last 168 hours  Radiological Exams on Admission: Dg Chest 2 View  12/27/2011  *RADIOLOGY REPORT*  Clinical Data: Breath for 72 hours.  CHEST - 2 VIEW  Comparison: 10/13/2009  Findings: Slightly shallow inspiration. The heart size and pulmonary vascularity are normal. The lungs appear clear and expanded without focal air space disease or consolidation. No blunting of the costophrenic angles.  No pneumothorax.  Mediastinal contours appear intact.  No significant changes since the previous study.  IMPRESSION: No evidence of active pulmonary disease.   Original Report Authenticated By: Marlon Pel, M.D.    Ct Angio Chest Pe W/cm &/or Wo Cm  12/27/2011  *RADIOLOGY REPORT*  Clinical Data: Shortness of breath.  Wheezing.  Cough.  Congestion.  CT ANGIOGRAPHY CHEST  Technique:  Multidetector CT imaging of the chest using the standard protocol during bolus administration of intravenous contrast. Multiplanar reconstructed images including MIPs were obtained and reviewed to evaluate the  vascular anatomy.  Contrast: OMNIPAQUE IOHEXOL 300 MG/ML  SOLN  Comparison: None.  Findings: Initial contrast bolus is suboptimal due to late timing and was repeated.  Despite repeating with a second dose of contrast, opacification of pulmonary arteries is suboptimal due to the dilutional effects, mildly late timing, and patient body habitus.  This reduces diagnostic sensitivity and specificity.  No filling defect is identified in the pulmonary arterial tree to suggest pulmonary embolus.  No aortic dissection or acute aortic findings noted.  An AP window lymph node has a short axis diameter of 10 mm, borderline prominent. A similar sized right lower paratracheal lymph node is present.  The lungs appear clear.  Mild thoracic spondylosis noted.  IMPRESSION:  1.  No evidence of pulmonary embolus.  Sensitivity is reduced due to patient body habitus and contrast dilution.  We repeated the contrast bolus and scan in attempts to obtain the best exam possible. 2.  Borderline prominent AP window and right lower paratracheal lymph nodes, technically nonspecific although statistically likely to be benign   Original Report Authenticated By: Dellia Cloud, M.D.     EKG: Independently reviewed. Normal sinus rhythm  Assessment/Plan Principal Problem:  *Hypoxia Active Problems:  Dyspnea  Tachycardia    #  1 shortness of breath given morbid obesity there was a concern for pulmonary embolism, elevated d-dimer of 0.79. CT angina negative for pulmonary embolism. Acute bronchitis? Started on IV steroids. Continue nebulizer treatments. BNP normal. Check a 2-D echo, cycle cardiac enzymes, ABG #2 sinus tachycardia check TSH, from his elevated start  beta blocker, anxiety? #3 abnormal d-dimer check her Doppler of bilateral lower extremities    Code Status:   full Family Communication: bedside Disposition Plan: admit   Time spent: 70 mins   [facility] Pager [phone]  If 7PM-7AM,  please contact night-coverage [email or web address] Password [email or web address] 12/27/2011, 1:03 PM

## 2011-12-27 NOTE — Progress Notes (Signed)
Bilateral:  No evidence of DVT, superficial thrombosis, or Baker's Cyst.  Technically difficult study due to the patient's body habitus.   

## 2011-12-27 NOTE — ED Notes (Signed)
Patient returned from CT

## 2011-12-27 NOTE — ED Notes (Signed)
Pt transported by this nurse to 1425 with chart and personal belongings on monitor, condition stable at time of transfer.

## 2011-12-28 DIAGNOSIS — R0902 Hypoxemia: Secondary | ICD-10-CM

## 2011-12-28 DIAGNOSIS — R000 Tachycardia, unspecified: Secondary | ICD-10-CM

## 2011-12-28 DIAGNOSIS — R0609 Other forms of dyspnea: Secondary | ICD-10-CM

## 2011-12-28 DIAGNOSIS — R0989 Other specified symptoms and signs involving the circulatory and respiratory systems: Secondary | ICD-10-CM

## 2011-12-28 DIAGNOSIS — J209 Acute bronchitis, unspecified: Principal | ICD-10-CM | POA: Diagnosis present

## 2011-12-28 LAB — BASIC METABOLIC PANEL
BUN: 10 mg/dL (ref 6–23)
CO2: 26 mEq/L (ref 19–32)
Calcium: 9.4 mg/dL (ref 8.4–10.5)
Chloride: 102 mEq/L (ref 96–112)
Creatinine, Ser: 0.67 mg/dL (ref 0.50–1.10)
GFR calc Af Amer: >90 mL/min (ref >90)
GFR calc non Af Amer: >90 mL/min (ref >90)
Glucose, Bld: 149 mg/dL — ABNORMAL HIGH (ref 70–99)
Potassium: 4.2 mEq/L (ref 3.5–5.1)
Sodium: 137 mEq/L (ref 135–145)

## 2011-12-28 LAB — CARDIAC PANEL(CRET KIN+CKTOT+MB+TROPI)
CK, MB: 7.5 ng/mL (ref 0.3–4.0)
Relative Index: 1.8 (ref 0.0–2.5)
Total CK: 416 U/L — ABNORMAL HIGH (ref 7–177)
Troponin I: <0.3 ng/mL (ref <0.30)

## 2011-12-28 MED ORDER — LEVOFLOXACIN 750 MG PO TABS: 750.0000 mg | ORAL_TABLET | Freq: Every day | ORAL | Status: AC

## 2011-12-28 MED ORDER — ENOXAPARIN SODIUM 100 MG/ML ~~LOC~~ SOLN
90.0000 mg | SUBCUTANEOUS | Status: DC
  Filled 2011-12-28: qty 1

## 2011-12-28 MED ORDER — ENOXAPARIN SODIUM 80 MG/0.8ML ~~LOC~~ SOLN
65.0000 mg | SUBCUTANEOUS | Status: DC
  Filled 2011-12-28: qty 0.8

## 2011-12-28 MED ORDER — FUROSEMIDE 10 MG/ML IJ SOLN
40.0000 mg | Freq: Once | INTRAMUSCULAR | Status: AC
  Administered 2011-12-28: 40 mg via INTRAVENOUS
  Filled 2011-12-28: qty 4

## 2011-12-28 MED ORDER — ALBUTEROL SULFATE (5 MG/ML) 0.5% IN NEBU
2.5000 mg | INHALATION_SOLUTION | Freq: Four times a day (QID) | RESPIRATORY_TRACT | Status: DC
  Administered 2011-12-28: 2.5 mg via RESPIRATORY_TRACT
  Filled 2011-12-28: qty 0.5

## 2011-12-28 MED ORDER — IPRATROPIUM BROMIDE 0.02 % IN SOLN: 0.5000 mg | Freq: Four times a day (QID) | RESPIRATORY_TRACT | Status: DC | PRN

## 2011-12-28 MED ORDER — PREDNISONE (PAK) 10 MG PO TABS: ORAL_TABLET | ORAL | Status: AC

## 2011-12-28 NOTE — Progress Notes (Signed)
Critical Value for CKMB-7.5 was called by lab. Night floor coverage, Craige Cotta, made aware. No new orders received at this time. Also CK toatal was 416. Will continue to monitor pt and lab values.

## 2011-12-28 NOTE — Progress Notes (Signed)
Talked to patient about follow up medical care, patient goes to her GYN Dr Lloyd Huger  And Okey Regal his NP for medical care and they take care of all of her medical needs; CM gave patient information on other resources that she may be interested in for follow up medical care such as the [facility], [facility], [facility]), [facility], [facility] and a list of Medicaid providers. Patient's pharmacy is [facility] on [address]- patient needs po antibiotic levoquin x 7 days; [facility] called and the cost of the medication is $138.39; patient stated that she cannot afford the cost of the medication. Main pharmacy called to see if the patient qualifies for the indigent funds and is was approved; Prescription sent to the pharmacy and they will call the bedside nurse when the medication is ready.

## 2011-12-28 NOTE — Discharge Summary (Signed)
Physician Discharge Summary  Virginia Morgan:[id number] DOB: 09-Jul-1955 DOA: 12/27/2011  PCP: No primary provider on file.  Admit date: 12/27/2011 Discharge date: 12/28/2011  Recommendations for Outpatient Follow-up:  1. Followup with primary care physician  Discharge Diagnoses:  Principal Problem:  *Hypoxia Active Problems:  Dyspnea  Tachycardia   Discharge Condition: Fair  Diet recommendation: Heart healthy diet  Filed Weights   12/27/11 1301  Weight: 182 kg (401 lb 3.8 oz)    History of present illness:  56 year old female with a history of breast cancer that presents emergency department with chief complaint of shortness of breath. Symptoms have been gradually worsening over the last week and associated with wheezing and intermittent chest tightness, cough, orthopnea, and bilateral leg swelling. She reports she's been sleeping in her recliner for the last 2 nights and believes that this is the cause of her lower extremity swelling. Patient states she had a similar presentation 3 years ago when she was diagnosed with pneumonia. She denies any chest pain, palpitations, syncope, fever, night sweats, chills, hemoptysis, calf pain, claudication, fever, night sweats or chills. Patient has not had any recent surgery, travel, and is not on hormone replacement therapy.   Hospital Course:   1. Acute bronchitis: Patient presented to the hospital with chest tightness, cough, wheezing and orthopnea. CT angio of the chest showed no evidence of pulmonary embolism. Because of orthopnea lower extremity edema 2-D echocardiogram was done and showed hyperdynamic left ventricle with ejection fraction of 75%. Her BNP is in the low side. Patient treated as acute bronchitis with antibiotics, inhaled bronchodilators and systemic steroids and patient improved very well. In the morning patient wanted to go home, although I wanted her to to stay for another night but she said she will be better off at home.  Patient discharged on Levaquin and prednisone taper. These medications provided from the hospital to the patient.  2. Lower extremity edema: This is was thought initially to be secondary to CHF, but BNP and 2-D echocardiogram did not show any evidence of CHF. Patient is on Norvasc, which probably contributing to her lower extremity edema, patient asked that to be changed but I reassured her that these her Norvasc is working, and this is away Norvasc treats high blood pressure with peripheral arteriolar dilation which can cause lower extremity edema.  3. Tachycardia: Patient had CT angiogram was done and showed no acute pulmonary findings, no evidence of PE. The tachycardia is likely secondary to the hypoxia patient was having. TSH is normal.  Procedures:  None  Consultations:  None  Discharge Exam: Filed Vitals:   12/28/11 0605  BP: 151/85  Pulse: 97  Temp: 97.6 F (36.4 C)  Resp: 20   Filed Vitals:   12/27/11 2041 12/27/11 2111 12/28/11 0605 12/28/11 0812  BP:  144/86 151/85   Pulse:  97 97   Temp:  98 F (36.7 C) 97.6 F (36.4 C)   TempSrc:  Oral Oral   Resp:  20 20   Height:      Weight:      SpO2: 96% 91% 93% 97%   General: Alert and awake, oriented x3, not in any acute distress. HEENT: anicteric sclera, pupils reactive to light and accommodation, EOMI CVS: S1-S2 clear, no murmur rubs or gallops Chest: clear to auscultation bilaterally, no wheezing, rales or rhonchi Abdomen: soft nontender, nondistended, normal bowel sounds, no organomegaly Extremities: no cyanosis, +2 edema noted bilaterally Neuro: Cranial nerves II-XII intact, no focal neurological deficits  Discharge  Instructions  Discharge Orders    Future Orders Please Complete By Expires   Diet - low sodium heart healthy      Increase activity slowly        Medication List  As of 12/28/2011  2:23 PM   TAKE these medications         amLODipine 10 MG tablet   Commonly known as: NORVASC   Take 10 mg by  mouth daily.      buPROPion 300 MG 24 hr tablet   Commonly known as: WELLBUTRIN XL   Take 300 mg by mouth daily.      citalopram 40 MG tablet   Commonly known as: CELEXA   Take 40 mg by mouth daily.      levofloxacin 750 MG tablet   Commonly known as: LEVAQUIN   Take 1 tablet (750 mg total) by mouth daily.      predniSONE 10 MG tablet   Commonly known as: STERAPRED UNI-PAK   Take 6-5-4-3-2-1 Tabs daily till gone              The results of significant diagnostics from this hospitalization (including imaging, microbiology, ancillary and laboratory) are listed below for reference.    Significant Diagnostic Studies: Dg Chest 2 View  12/27/2011  *RADIOLOGY REPORT*  Clinical Data: Breath for 72 hours.  CHEST - 2 VIEW  Comparison: 10/13/2009  Findings: Slightly shallow inspiration. The heart size and pulmonary vascularity are normal. The lungs appear clear and expanded without focal air space disease or consolidation. No blunting of the costophrenic angles.  No pneumothorax.  Mediastinal contours appear intact.  No significant changes since the previous study.  IMPRESSION: No evidence of active pulmonary disease.   Original Report Authenticated By: Marlon Pel, M.D.    Ct Angio Chest Pe W/cm &/or Wo Cm  12/27/2011  *RADIOLOGY REPORT*  Clinical Data: Shortness of breath.  Wheezing.  Cough.  Congestion.  CT ANGIOGRAPHY CHEST  Technique:  Multidetector CT imaging of the chest using the standard protocol during bolus administration of intravenous contrast. Multiplanar reconstructed images including MIPs were obtained and reviewed to evaluate the vascular anatomy.  Contrast: OMNIPAQUE IOHEXOL 300 MG/ML  SOLN  Comparison: None.  Findings: Initial contrast bolus is suboptimal due to late timing and was repeated.  Despite repeating with a second dose of contrast, opacification of pulmonary arteries is suboptimal due to the dilutional effects, mildly late timing, and patient body  habitus.  This reduces diagnostic sensitivity and specificity.  No filling defect is identified in the pulmonary arterial tree to suggest pulmonary embolus.  No aortic dissection or acute aortic findings noted.  An AP window lymph node has a short axis diameter of 10 mm, borderline prominent. A similar sized right lower paratracheal lymph node is present.  The lungs appear clear.  Mild thoracic spondylosis noted.  IMPRESSION:  1.  No evidence of pulmonary embolus.  Sensitivity is reduced due to patient body habitus and contrast dilution.  We repeated the contrast bolus and scan in attempts to obtain the best exam possible. 2.  Borderline prominent AP window and right lower paratracheal lymph nodes, technically nonspecific although statistically likely to be benign   Original Report Authenticated By: Dellia Cloud, M.D.     Microbiology: No results found for this or any previous visit (from the past 240 hour(s)).   Labs: Basic Metabolic Panel:  Lab 12/28/11 4098 12/27/11 1222 12/27/11 0610  NA 137 -- 136  K 4.2 --  4.0  CL 102 -- 103  CO2 26 -- 27  GLUCOSE 149* -- 115*  BUN 10 -- 10  CREATININE 0.67 0.77 0.75  CALCIUM 9.4 -- 9.1  MG -- 2.0 --  PHOS -- -- --   Liver Function Tests:  Lab 12/27/11 1222  AST 19  ALT 14  ALKPHOS 77  BILITOT 0.3  PROT 7.1  ALBUMIN 3.8   No results found for this basename: LIPASE:5,AMYLASE:5 in the last 168 hours No results found for this basename: AMMONIA:5 in the last 168 hours CBC:  Lab 12/27/11 1222 12/27/11 0610  WBC 8.2 7.5  NEUTROABS -- --  HGB 12.7 12.1  HCT 39.8 37.7  MCV 93.0 92.9  PLT 266 243   Cardiac Enzymes:  Lab 12/28/11 0416 12/27/11 1950 12/27/11 1209  CKTOTAL 416* 246* 183*  CKMB 7.5* 5.3* 4.1*  CKMBINDEX -- -- --  TROPONINI <0.30 <0.30 <0.30   BNP: BNP (last 3 results)  Basename 12/27/11 0610  PROBNP 44.5   CBG: No results found for this basename: GLUCAP:5 in the last 168 hours  Time coordinating discharge:  40 minutes  Signed:  Kaison Mcparland A  [facility] 12/28/2011, 2:23 PM

## 2012-01-23 ENCOUNTER — Emergency Department (HOSPITAL_COMMUNITY)
Admission: EM | Admit: 2012-01-23 | Discharge: 2012-01-24 | Disposition: A | Discharge status: Home / Self Care | Payer: Self-pay | Attending: Emergency Medicine | Admitting: Emergency Medicine

## 2012-01-23 ENCOUNTER — Encounter (HOSPITAL_COMMUNITY): Payer: Self-pay | Admitting: Emergency Medicine

## 2012-01-23 ENCOUNTER — Emergency Department (HOSPITAL_COMMUNITY): Payer: Self-pay

## 2012-01-23 DIAGNOSIS — Z923 Personal history of irradiation: Secondary | ICD-10-CM | POA: Insufficient documentation

## 2012-01-23 DIAGNOSIS — F3289 Other specified depressive episodes: Secondary | ICD-10-CM | POA: Insufficient documentation

## 2012-01-23 DIAGNOSIS — R600 Localized edema: Secondary | ICD-10-CM

## 2012-01-23 DIAGNOSIS — Z882 Allergy status to sulfonamides status: Secondary | ICD-10-CM | POA: Insufficient documentation

## 2012-01-23 DIAGNOSIS — Z91038 Other insect allergy status: Secondary | ICD-10-CM | POA: Insufficient documentation

## 2012-01-23 DIAGNOSIS — R609 Edema, unspecified: Secondary | ICD-10-CM | POA: Insufficient documentation

## 2012-01-23 DIAGNOSIS — Z809 Family history of malignant neoplasm, unspecified: Secondary | ICD-10-CM | POA: Insufficient documentation

## 2012-01-23 DIAGNOSIS — I10 Essential (primary) hypertension: Secondary | ICD-10-CM | POA: Insufficient documentation

## 2012-01-23 DIAGNOSIS — F329 Major depressive disorder, single episode, unspecified: Secondary | ICD-10-CM | POA: Insufficient documentation

## 2012-01-23 DIAGNOSIS — Z853 Personal history of malignant neoplasm of breast: Secondary | ICD-10-CM | POA: Insufficient documentation

## 2012-01-23 DIAGNOSIS — Z8249 Family history of ischemic heart disease and other diseases of the circulatory system: Secondary | ICD-10-CM | POA: Insufficient documentation

## 2012-01-23 DIAGNOSIS — Z880 Allergy status to penicillin: Secondary | ICD-10-CM | POA: Insufficient documentation

## 2012-01-23 DIAGNOSIS — F411 Generalized anxiety disorder: Secondary | ICD-10-CM | POA: Insufficient documentation

## 2012-01-23 DIAGNOSIS — Z833 Family history of diabetes mellitus: Secondary | ICD-10-CM | POA: Insufficient documentation

## 2012-01-23 MED ORDER — FUROSEMIDE 10 MG/ML IJ SOLN
80.0000 mg | Freq: Once | INTRAMUSCULAR | Status: AC
  Administered 2012-01-24: 80 mg via INTRAVENOUS
  Filled 2012-01-23: qty 8

## 2012-01-23 NOTE — ED Notes (Signed)
Patient transported to X-ray 

## 2012-01-23 NOTE — ED Provider Notes (Signed)
History     CSN: [id number]  Arrival date & time 01/23/12  1752   First MD Initiated Contact with Patient 01/23/12 2321      Chief Complaint  Patient presents with  . Leg Swelling   HPI  History provided by the patient. Patient is a 56 year old female with history of hypertension, depression, anxiety, past breast cancer with radiation treatment who presents with complaints of increasing lower extremity swelling and slight shortness of breath symptoms. Patient was seen and evaluated for similar symptoms several weeks ago with hospital admission. Patient states she had extensive workup at that time was given medications and IV to help with her swelling. She was given referrals to followup but states she's been unable to because of financial reasons. Since that time swelling has gradually increased in lower extremities and patient reports some return of shortness of breath symptoms. She denies any other complaints. Denies any fever, chills or sweats. Denies any chest pain or palpitations. Denies any erythema of the skin.    Past Medical History  Diagnosis Date  . Pneumonia   . Breast cancer     radiation    Past Surgical History  Procedure Date  . Appendectomy   . Mandible fracture surgery   . Left lumpectomy     Family History  Problem Relation Age of Onset  . Diabetes Mother   . Cancer Mother   . Hypertension Other   . Coronary artery disease Other     History  Substance Use Topics  . Smoking status: Never Smoker   . Smokeless tobacco: Former Neurosurgeon  . Alcohol Use: Yes     Occasional Wine    OB History    Grav Para Term Preterm Abortions TAB SAB Ect Mult Living                  Review of Systems  Constitutional: Negative for fever and chills.  Respiratory: Positive for shortness of breath. Negative for cough.   Cardiovascular: Positive for leg swelling. Negative for chest pain and palpitations.  Gastrointestinal: Negative for nausea and abdominal pain.    Genitourinary: Negative for dysuria, frequency, hematuria and flank pain.  Musculoskeletal: Negative for back pain.    Allergies  Penicillins; Bee venom; and Sulfa antibiotics  Home Medications   Current Outpatient Rx  Name Route Sig Dispense Refill  . AMLODIPINE BESYLATE 10 MG PO TABS Oral Take 10 mg by mouth daily.    . BUPROPION HCL ER (XL) 300 MG PO TB24 Oral Take 300 mg by mouth daily.    Marland Kitchen CITALOPRAM HYDROBROMIDE 40 MG PO TABS Oral Take 40 mg by mouth daily.      BP 193/105  Pulse 85  Temp 98.2 F (36.8 C) (Oral)  Resp 24  SpO2 98%  Physical Exam  Nursing note and vitals reviewed. Constitutional: She is oriented to person, place, and time. She appears well-developed and well-nourished. No distress.       Morbidly obese  HENT:  Head: Normocephalic.  Cardiovascular: Normal rate and regular rhythm.   No murmur heard. Pulmonary/Chest: Effort normal and breath sounds normal. No respiratory distress. She has no wheezes. She has no rales.  Abdominal: Soft. There is no tenderness.  Musculoskeletal: She exhibits edema and tenderness.       Exam is limited due to body habitus. Bilateral lower extremity swelling up to the knees. 2+ pitting edema. Normal sensation and pulses in foot. No significant increased warmth in legs.  Neurological: She is alert  and oriented to person, place, and time.  Skin: Skin is warm and dry. No rash noted.  Psychiatric: She has a normal mood and affect. Her behavior is normal.    ED Course  Procedures   Results for orders placed during the hospital encounter of 01/23/12  PRO B NATRIURETIC PEPTIDE      Component Value Range   Pro B Natriuretic peptide (BNP) 84.3  0 - 125 pg/mL  CBC WITH DIFFERENTIAL      Component Value Range   WBC 7.6  4.0 - 10.5 K/uL   RBC 4.33  3.87 - 5.11 MIL/uL   Hemoglobin 12.8  12.0 - 15.0 g/dL   HCT 16.1  09.6 - 04.5 %   MCV 91.7  78.0 - 100.0 fL   MCH 29.6  26.0 - 34.0 pg   MCHC 32.2  30.0 - 36.0 g/dL   RDW 40.9   81.1 - 91.4 %   Platelets 271  150 - 400 K/uL   Neutrophils Relative 55  43 - 77 %   Neutro Abs 4.2  1.7 - 7.7 K/uL   Lymphocytes Relative 33  12 - 46 %   Lymphs Abs 2.5  0.7 - 4.0 K/uL   Monocytes Relative 6  3 - 12 %   Monocytes Absolute 0.4  0.1 - 1.0 K/uL   Eosinophils Relative 6 (*) 0 - 5 %   Eosinophils Absolute 0.5  0.0 - 0.7 K/uL   Basophils Relative 0  0 - 1 %   Basophils Absolute 0.0  0.0 - 0.1 K/uL  BASIC METABOLIC PANEL      Component Value Range   Sodium 137  135 - 145 mEq/L   Potassium 3.6  3.5 - 5.1 mEq/L   Chloride 99  96 - 112 mEq/L   CO2 25  19 - 32 mEq/L   Glucose, Bld 103 (*) 70 - 99 mg/dL   BUN 10  6 - 23 mg/dL   Creatinine, Ser 7.82  0.50 - 1.10 mg/dL   Calcium 9.3  8.4 - 95.6 mg/dL   GFR calc non Af Amer >90  >90 mL/min   GFR calc Af Amer >90  >90 mL/min  URINALYSIS, ROUTINE W REFLEX MICROSCOPIC      Component Value Range   Color, Urine YELLOW  YELLOW   APPearance CLOUDY (*) CLEAR   Specific Gravity, Urine 1.016  1.005 - 1.030   pH 6.0  5.0 - 8.0   Glucose, UA NEGATIVE  NEGATIVE mg/dL   Hgb urine dipstick NEGATIVE  NEGATIVE   Bilirubin Urine NEGATIVE  NEGATIVE   Ketones, ur NEGATIVE  NEGATIVE mg/dL   Protein, ur NEGATIVE  NEGATIVE mg/dL   Urobilinogen, UA 0.2  0.0 - 1.0 mg/dL   Nitrite NEGATIVE  NEGATIVE   Leukocytes, UA NEGATIVE  NEGATIVE       Dg Chest 2 View  01/24/2012  *RADIOLOGY REPORT*  Clinical Data: Lower extremity swelling.  CHEST - 2 VIEW  Comparison: CT and plain films chest 12/27/2011.  Findings: Heart size is normal.  No pulmonary edema or focal airspace disease.  No pneumothorax or effusion.  IMPRESSION: No acute disease.   Original Report Authenticated By: Bernadene Bell. D'ALESSIO, M.D.      1. Bilateral edema of lower extremity       MDM  11:30 PM patient seen and evaluated. Patient tearful but appears comfortable otherwise in no acute distress. Patient's symptoms are similar to recent presentation to the emergency room and  admission.  Medical charts reviewed. Patient had extensive workup for similar symptoms of lower extremity swelling and shortness of breath symptoms. Patient had negative CT and your chest, unremarkable 2-D echo heart and no significant elevated BNP. Patient did have medication changes recommended. She was also given PCP referral. PSA she is unable to followup due to  $50 co-pay which she cannot afford.        Angus Seller, [place] 01/24/12 [phone]

## 2012-01-23 NOTE — ED Notes (Signed)
Pt states she has had swelling in her lower extremities and numbness in her right hand  Pt states she is having swelling in both legs but her right leg is worse and has been having discoloration in her right foot and it is painful  Pt states she has been very short of breath worse on exertion  Pt states she was here about a month ago for same

## 2012-01-24 LAB — CBC WITH DIFFERENTIAL/PLATELET
Basophils Absolute: 0 10*3/uL (ref 0.0–0.1)
Basophils Relative: 0 % (ref 0–1)
Eosinophils Absolute: 0.5 10*3/uL (ref 0.0–0.7)
Eosinophils Relative: 6 % — ABNORMAL HIGH (ref 0–5)
HCT: 39.7 % (ref 36.0–46.0)
Hemoglobin: 12.8 g/dL (ref 12.0–15.0)
Lymphocytes Relative: 33 % (ref 12–46)
Lymphs Abs: 2.5 10*3/uL (ref 0.7–4.0)
MCH: 29.6 pg (ref 26.0–34.0)
MCHC: 32.2 g/dL (ref 30.0–36.0)
MCV: 91.7 fL (ref 78.0–100.0)
Monocytes Absolute: 0.4 10*3/uL (ref 0.1–1.0)
Monocytes Relative: 6 % (ref 3–12)
Neutro Abs: 4.2 10*3/uL (ref 1.7–7.7)
Neutrophils Relative %: 55 % (ref 43–77)
Platelets: 271 10*3/uL (ref 150–400)
RBC: 4.33 MIL/uL (ref 3.87–5.11)
RDW: 14.3 % (ref 11.5–15.5)
WBC: 7.6 10*3/uL (ref 4.0–10.5)

## 2012-01-24 LAB — BASIC METABOLIC PANEL
BUN: 10 mg/dL (ref 6–23)
CO2: 25 mEq/L (ref 19–32)
Calcium: 9.3 mg/dL (ref 8.4–10.5)
Chloride: 99 mEq/L (ref 96–112)
Creatinine, Ser: 0.78 mg/dL (ref 0.50–1.10)
GFR calc Af Amer: >90 mL/min (ref >90)
GFR calc non Af Amer: >90 mL/min (ref >90)
Glucose, Bld: 103 mg/dL — ABNORMAL HIGH (ref 70–99)
Potassium: 3.6 mEq/L (ref 3.5–5.1)
Sodium: 137 mEq/L (ref 135–145)

## 2012-01-24 LAB — URINALYSIS, ROUTINE W REFLEX MICROSCOPIC
Bilirubin Urine: NEGATIVE
Glucose, UA: NEGATIVE mg/dL
Hgb urine dipstick: NEGATIVE
Ketones, ur: NEGATIVE mg/dL
Leukocytes, UA: NEGATIVE
Nitrite: NEGATIVE
Protein, ur: NEGATIVE mg/dL
Specific Gravity, Urine: 1.016 (ref 1.005–1.030)
Urobilinogen, UA: 0.2 mg/dL (ref 0.0–1.0)
pH: 6 (ref 5.0–8.0)

## 2012-01-24 LAB — PRO B NATRIURETIC PEPTIDE: Pro B Natriuretic peptide (BNP): 84.3 pg/mL (ref 0–125)

## 2012-01-24 MED ORDER — FUROSEMIDE 20 MG PO TABS: 40.0000 mg | ORAL_TABLET | Freq: Two times a day (BID) | ORAL | Status: DC

## 2012-01-24 NOTE — ED Provider Notes (Signed)
Medical screening examination/treatment/procedure(s) were performed by non-physician practitioner and as supervising physician I was immediately available for consultation/collaboration.  Tennie Grussing M Nephi Savage, MD 01/24/12 0822 

## 2012-02-16 NOTE — ED Provider Notes (Signed)
Medical screening examination/treatment/procedure(s) were performed by non-physician practitioner and as supervising physician I was immediately available for consultation/collaboration.   Lyanne Co, MD 02/16/12 1534

## 2012-03-12 ENCOUNTER — Other Ambulatory Visit (HOSPITAL_COMMUNITY): Payer: Self-pay | Admitting: Obstetrics & Gynecology

## 2012-03-21 ENCOUNTER — Other Ambulatory Visit (HOSPITAL_COMMUNITY): Payer: Self-pay | Admitting: Obstetrics and Gynecology

## 2012-03-21 DIAGNOSIS — Z1231 Encounter for screening mammogram for malignant neoplasm of breast: Secondary | ICD-10-CM

## 2012-04-06 ENCOUNTER — Ambulatory Visit (HOSPITAL_COMMUNITY)
Admission: RE | Admit: 2012-04-06 | Discharge: 2012-04-06 | Disposition: A | Discharge status: Home / Self Care | Payer: Self-pay | Source: Ambulatory Visit | Attending: Obstetrics and Gynecology | Admitting: Obstetrics and Gynecology

## 2012-04-06 DIAGNOSIS — Z1231 Encounter for screening mammogram for malignant neoplasm of breast: Secondary | ICD-10-CM

## 2012-10-08 ENCOUNTER — Encounter (HOSPITAL_COMMUNITY): Payer: Self-pay | Admitting: *Deleted

## 2012-10-08 ENCOUNTER — Emergency Department (HOSPITAL_COMMUNITY)
Admission: EM | Admit: 2012-10-08 | Discharge: 2012-10-08 | Disposition: A | Discharge status: Home / Self Care | Payer: Self-pay | Attending: Emergency Medicine | Admitting: Emergency Medicine

## 2012-10-08 ENCOUNTER — Emergency Department (HOSPITAL_COMMUNITY): Payer: Self-pay

## 2012-10-08 DIAGNOSIS — R062 Wheezing: Secondary | ICD-10-CM | POA: Insufficient documentation

## 2012-10-08 DIAGNOSIS — C50919 Malignant neoplasm of unspecified site of unspecified female breast: Secondary | ICD-10-CM | POA: Insufficient documentation

## 2012-10-08 DIAGNOSIS — R05 Cough: Secondary | ICD-10-CM | POA: Insufficient documentation

## 2012-10-08 DIAGNOSIS — M7989 Other specified soft tissue disorders: Secondary | ICD-10-CM | POA: Insufficient documentation

## 2012-10-08 DIAGNOSIS — Z923 Personal history of irradiation: Secondary | ICD-10-CM | POA: Insufficient documentation

## 2012-10-08 DIAGNOSIS — J209 Acute bronchitis, unspecified: Secondary | ICD-10-CM | POA: Insufficient documentation

## 2012-10-08 DIAGNOSIS — Z8701 Personal history of pneumonia (recurrent): Secondary | ICD-10-CM | POA: Insufficient documentation

## 2012-10-08 DIAGNOSIS — Z79899 Other long term (current) drug therapy: Secondary | ICD-10-CM | POA: Insufficient documentation

## 2012-10-08 DIAGNOSIS — R059 Cough, unspecified: Secondary | ICD-10-CM | POA: Insufficient documentation

## 2012-10-08 LAB — CBC
HCT: 41.8 % (ref 36.0–46.0)
Hemoglobin: 13.5 g/dL (ref 12.0–15.0)
MCH: 29 pg (ref 26.0–34.0)
MCHC: 32.3 g/dL (ref 30.0–36.0)
MCV: 89.7 fL (ref 78.0–100.0)
Platelets: 225 10*3/uL (ref 150–400)
RBC: 4.66 MIL/uL (ref 3.87–5.11)
RDW: 14.5 % (ref 11.5–15.5)
WBC: 9.2 10*3/uL (ref 4.0–10.5)

## 2012-10-08 LAB — POCT I-STAT, CHEM 8
BUN: 15 mg/dL (ref 6–23)
Calcium, Ion: 1.13 mmol/L (ref 1.12–1.23)
Chloride: 101 mEq/L (ref 96–112)
Creatinine, Ser: 0.8 mg/dL (ref 0.50–1.10)
Glucose, Bld: 107 mg/dL — ABNORMAL HIGH (ref 70–99)
HCT: 42 % (ref 36.0–46.0)
Hemoglobin: 14.3 g/dL (ref 12.0–15.0)
Potassium: 4 mEq/L (ref 3.5–5.1)
Sodium: 139 mEq/L (ref 135–145)
TCO2: 28 mmol/L (ref 0–100)

## 2012-10-08 MED ORDER — PREDNISONE 20 MG PO TABS: 60.0000 mg | ORAL_TABLET | Freq: Every day | ORAL | Status: DC

## 2012-10-08 MED ORDER — PREDNISONE 20 MG PO TABS
60.0000 mg | ORAL_TABLET | Freq: Once | ORAL | Status: AC
  Administered 2012-10-08: 60 mg via ORAL
  Filled 2012-10-08: qty 3

## 2012-10-08 MED ORDER — ALBUTEROL SULFATE HFA 108 (90 BASE) MCG/ACT IN AERS
2.0000 | INHALATION_SPRAY | RESPIRATORY_TRACT | Status: DC | PRN
  Administered 2012-10-08: 2 via RESPIRATORY_TRACT
  Filled 2012-10-08: qty 6.7

## 2012-10-08 MED ORDER — IPRATROPIUM BROMIDE 0.02 % IN SOLN
0.5000 mg | Freq: Once | RESPIRATORY_TRACT | Status: AC
  Administered 2012-10-08: 0.5 mg via RESPIRATORY_TRACT
  Filled 2012-10-08: qty 2.5

## 2012-10-08 MED ORDER — ALBUTEROL SULFATE (5 MG/ML) 0.5% IN NEBU
5.0000 mg | INHALATION_SOLUTION | Freq: Once | RESPIRATORY_TRACT | Status: AC
  Administered 2012-10-08: 5 mg via RESPIRATORY_TRACT
  Filled 2012-10-08: qty 1

## 2012-10-08 NOTE — ED Notes (Signed)
Patient transported to X-ray 

## 2012-10-08 NOTE — ED Notes (Signed)
Returned from xray

## 2012-10-08 NOTE — ED Notes (Signed)
Pt in c/o shortness of breath and cough over last few days, states symptoms have gotten progressively worse. Pt with audible wheezing noted. Speaking in full sentences. Pt states fever over first few days but none today.

## 2012-10-08 NOTE — ED Provider Notes (Signed)
History     CSN: [id number]  Arrival date & time 10/08/12  3086   First MD Initiated Contact with Patient 10/08/12 0050      Chief Complaint  Patient presents with  . Shortness of Breath    (Consider location/radiation/quality/duration/timing/severity/associated sxs/prior treatment) Patient is a 57 y.o. female presenting with shortness of breath. The history is provided by the patient.  Shortness of Breath Severity:  Moderate Associated symptoms: wheezing   Associated symptoms: no abdominal pain, no chest pain, no headaches, no rash and no vomiting    patient has had a productive cough for the last week. No fevers. She states she's bringing up until sputum. No chest pain. No swelling or legs. She states she's been unable to do as much physical activity is normal. She states normally she can walk to the mailbox and back but not been having difficulty walking across her room. No chest pain. No abdominal pain. No fevers the last couple days but had a fever at the beginning of the coughing. She has a previous history of pneumonia.  Past Medical History  Diagnosis Date  . Pneumonia   . Breast cancer     radiation    Past Surgical History  Procedure Laterality Date  . Appendectomy    . Mandible fracture surgery    . Left lumpectomy      Family History  Problem Relation Age of Onset  . Diabetes Mother   . Cancer Mother   . Hypertension Other   . Coronary artery disease Other     History  Substance Use Topics  . Smoking status: Never Smoker   . Smokeless tobacco: Former Neurosurgeon  . Alcohol Use: Yes     Comment: Occasional Wine    OB History   Grav Para Term Preterm Abortions TAB SAB Ect Mult Living                  Review of Systems  Constitutional: Negative for activity change and appetite change.  HENT: Negative for neck stiffness.   Eyes: Negative for pain.  Respiratory: Positive for shortness of breath and wheezing. Negative for chest tightness.   Cardiovascular:  Negative for chest pain and leg swelling.  Gastrointestinal: Negative for nausea, vomiting, abdominal pain and diarrhea.  Genitourinary: Negative for flank pain.  Musculoskeletal: Negative for back pain.  Skin: Negative for rash.  Neurological: Negative for weakness, numbness and headaches.  Psychiatric/Behavioral: Negative for behavioral problems.    Allergies  Penicillins; Bee venom; and Sulfa antibiotics  Home Medications   Current Outpatient Rx  Name  Route  Sig  Dispense  Refill  . ALPRAZolam (XANAX) 0.5 MG tablet   Oral   Take 0.5 mg by mouth 3 (three) times daily as needed for anxiety.         Marland Kitchen atenolol (TENORMIN) 50 MG tablet   Oral   Take 50 mg by mouth daily.         . cetirizine (ZYRTEC) 10 MG tablet   Oral   Take 10 mg by mouth daily.         Marland Kitchen FLUoxetine (PROZAC) 20 MG tablet   Oral   Take 60 mg by mouth daily.         . hydrochlorothiazide (HYDRODIURIL) 25 MG tablet   Oral   Take 25 mg by mouth daily.         . traZODone (DESYREL) 100 MG tablet   Oral   Take 50-100 mg by mouth at  bedtime as needed for sleep.         . predniSONE (DELTASONE) 20 MG tablet   Oral   Take 3 tablets (60 mg total) by mouth daily.   3 tablet   0     BP 113/71  Pulse 86  Temp(Src) 98.3 F (36.8 C) (Oral)  Resp 20  Ht 5\' 4"  (1.626 m)  Wt 404 lb 5 oz (183.395 kg)  BMI 69.37 kg/m2  SpO2 92%  Physical Exam  Nursing note and vitals reviewed. Constitutional: She is oriented to person, place, and time. She appears well-developed and well-nourished.  Patient is morbidly obese  HENT:  Head: Normocephalic and atraumatic.  Eyes: EOM are normal. Pupils are equal, round, and reactive to light.  Neck: Normal range of motion. Neck supple.  Cardiovascular: Normal rate, regular rhythm and normal heart sounds.   No murmur heard. Pulmonary/Chest: Effort normal. No respiratory distress. She has wheezes. She has no rales.  Diffuse wheezes and prolonged expirations.   Abdominal: Soft. Bowel sounds are normal. She exhibits no distension. There is no tenderness. There is no rebound and no guarding.  Musculoskeletal: Normal range of motion. She exhibits edema.  Trace bilateral lower extremity pitting edema.  Neurological: She is alert and oriented to person, place, and time. No cranial nerve deficit.  Skin: Skin is warm and dry.  Psychiatric: She has a normal mood and affect. Her speech is normal.    ED Course  Procedures (including critical care time)  Labs Reviewed  POCT I-STAT, CHEM 8 - Abnormal; Notable for the following:    Glucose, Bld 107 (*)    All other components within normal limits  CBC   Dg Chest 2 View  10/08/2012   *RADIOLOGY REPORT*  Clinical Data: Shortness of breath  CHEST - 2 VIEW  Comparison: 01/23/2012  Findings: Cardiac leads overlie the chest.  Heart size is normal. Lung volumes are low but clear.  No pleural effusion.  No acute osseous finding.  IMPRESSION: No acute cardiopulmonary process.   Original Report Authenticated By: Christiana Pellant, M.D.     1. Acute bronchitis      Date: 10/08/2012  Rate: 68  Rhythm: normal sinus rhythm  QRS Axis: normal  Intervals: normal  ST/T Wave abnormalities: normal  Conduction Disutrbances:none  Narrative Interpretation:   Old EKG Reviewed: unchanged    MDM  Patient with shortness of breath. X-ray negative or pneumonia. EKG reassuring. Able to ambulate without severe dyspnea or hypoxia. He'll be given small dose of steroids and inhaler and will follow with Dr. she does not appear to be in CHF.   Juliet Rude. Rubin Payor, MD 10/08/12 [phone]

## 2012-10-08 NOTE — ED Notes (Signed)
RT called to administer a breathing treatment

## 2012-10-08 NOTE — ED Notes (Signed)
MD at bedside. 

## 2012-10-08 NOTE — ED Notes (Addendum)
Pt ambulated 150 feet.   Pt Oxygen Sat level maintained between 90 to 93.  Pulse level range from 98 to 101 at end of ambulation.  Pt began coughing and sound raspy at end of ambulation.

## 2013-03-05 ENCOUNTER — Other Ambulatory Visit: Payer: Self-pay | Admitting: Obstetrics

## 2013-03-05 ENCOUNTER — Other Ambulatory Visit (HOSPITAL_COMMUNITY): Payer: Self-pay | Admitting: Obstetrics and Gynecology

## 2013-03-05 DIAGNOSIS — Z1231 Encounter for screening mammogram for malignant neoplasm of breast: Secondary | ICD-10-CM

## 2013-04-12 ENCOUNTER — Ambulatory Visit (HOSPITAL_COMMUNITY)
Admission: RE | Admit: 2013-04-12 | Discharge: 2013-04-12 | Disposition: A | Discharge status: Home / Self Care | Payer: Self-pay | Source: Ambulatory Visit | Attending: Obstetrics and Gynecology | Admitting: Obstetrics and Gynecology

## 2013-04-12 DIAGNOSIS — Z1231 Encounter for screening mammogram for malignant neoplasm of breast: Secondary | ICD-10-CM

## 2014-02-21 ENCOUNTER — Emergency Department (HOSPITAL_COMMUNITY): Payer: Self-pay

## 2014-02-21 ENCOUNTER — Encounter (HOSPITAL_COMMUNITY): Payer: Self-pay | Admitting: Emergency Medicine

## 2014-02-21 ENCOUNTER — Emergency Department (HOSPITAL_COMMUNITY)
Admission: EM | Admit: 2014-02-21 | Discharge: 2014-02-22 | Disposition: A | Discharge status: Home / Self Care | Payer: Self-pay | Attending: Emergency Medicine | Admitting: Emergency Medicine

## 2014-02-21 DIAGNOSIS — Z8701 Personal history of pneumonia (recurrent): Secondary | ICD-10-CM | POA: Insufficient documentation

## 2014-02-21 DIAGNOSIS — M545 Low back pain: Secondary | ICD-10-CM | POA: Insufficient documentation

## 2014-02-21 DIAGNOSIS — R1013 Epigastric pain: Secondary | ICD-10-CM

## 2014-02-21 DIAGNOSIS — Z853 Personal history of malignant neoplasm of breast: Secondary | ICD-10-CM | POA: Insufficient documentation

## 2014-02-21 DIAGNOSIS — R110 Nausea: Secondary | ICD-10-CM

## 2014-02-21 DIAGNOSIS — E860 Dehydration: Secondary | ICD-10-CM

## 2014-02-21 DIAGNOSIS — K5900 Constipation, unspecified: Secondary | ICD-10-CM

## 2014-02-21 DIAGNOSIS — F329 Major depressive disorder, single episode, unspecified: Secondary | ICD-10-CM | POA: Insufficient documentation

## 2014-02-21 DIAGNOSIS — R1033 Periumbilical pain: Secondary | ICD-10-CM | POA: Insufficient documentation

## 2014-02-21 DIAGNOSIS — Z87891 Personal history of nicotine dependence: Secondary | ICD-10-CM | POA: Insufficient documentation

## 2014-02-21 DIAGNOSIS — Z7952 Long term (current) use of systemic steroids: Secondary | ICD-10-CM | POA: Insufficient documentation

## 2014-02-21 DIAGNOSIS — Z79899 Other long term (current) drug therapy: Secondary | ICD-10-CM | POA: Insufficient documentation

## 2014-02-21 DIAGNOSIS — Z880 Allergy status to penicillin: Secondary | ICD-10-CM | POA: Insufficient documentation

## 2014-02-21 DIAGNOSIS — F419 Anxiety disorder, unspecified: Secondary | ICD-10-CM | POA: Insufficient documentation

## 2014-02-21 DIAGNOSIS — I10 Essential (primary) hypertension: Secondary | ICD-10-CM | POA: Insufficient documentation

## 2014-02-21 LAB — CBC WITH DIFFERENTIAL/PLATELET
Basophils Absolute: 0 10*3/uL (ref 0.0–0.1)
Basophils Relative: 0 % (ref 0–1)
Eosinophils Absolute: 0.1 10*3/uL (ref 0.0–0.7)
Eosinophils Relative: 1 % (ref 0–5)
HCT: 39.9 % (ref 36.0–46.0)
Hemoglobin: 13.2 g/dL (ref 12.0–15.0)
Lymphocytes Relative: 11 % — ABNORMAL LOW (ref 12–46)
Lymphs Abs: 1 10*3/uL (ref 0.7–4.0)
MCH: 31 pg (ref 26.0–34.0)
MCHC: 33.1 g/dL (ref 30.0–36.0)
MCV: 93.7 fL (ref 78.0–100.0)
Monocytes Absolute: 0.4 10*3/uL (ref 0.1–1.0)
Monocytes Relative: 4 % (ref 3–12)
Neutro Abs: 7.7 10*3/uL (ref 1.7–7.7)
Neutrophils Relative %: 84 % — ABNORMAL HIGH (ref 43–77)
Platelets: 284 10*3/uL (ref 150–400)
RBC: 4.26 MIL/uL (ref 3.87–5.11)
RDW: 13.2 % (ref 11.5–15.5)
WBC: 9.3 10*3/uL (ref 4.0–10.5)

## 2014-02-21 LAB — LIPASE, BLOOD: Lipase: 20 U/L (ref 11–59)

## 2014-02-21 LAB — COMPREHENSIVE METABOLIC PANEL
ALT: 51 U/L — ABNORMAL HIGH (ref 0–35)
AST: 102 U/L — ABNORMAL HIGH (ref 0–37)
Albumin: 3.9 g/dL (ref 3.5–5.2)
Alkaline Phosphatase: 78 U/L (ref 39–117)
Anion gap: 13 (ref 5–15)
BUN: 14 mg/dL (ref 6–23)
CO2: 28 mEq/L (ref 19–32)
Calcium: 9.3 mg/dL (ref 8.4–10.5)
Chloride: 101 mEq/L (ref 96–112)
Creatinine, Ser: 0.92 mg/dL (ref 0.50–1.10)
GFR calc Af Amer: 79 mL/min — ABNORMAL LOW (ref >90)
GFR calc non Af Amer: 68 mL/min — ABNORMAL LOW (ref >90)
Glucose, Bld: 140 mg/dL — ABNORMAL HIGH (ref 70–99)
Potassium: 4 mEq/L (ref 3.7–5.3)
Sodium: 142 mEq/L (ref 137–147)
Total Bilirubin: 1 mg/dL (ref 0.3–1.2)
Total Protein: 7.5 g/dL (ref 6.0–8.3)

## 2014-02-21 MED ORDER — ONDANSETRON HCL 8 MG PO TABS: 8.0000 mg | ORAL_TABLET | Freq: Three times a day (TID) | ORAL | Status: DC | PRN

## 2014-02-21 MED ORDER — RANITIDINE HCL 150 MG PO TABS: 150.0000 mg | ORAL_TABLET | Freq: Two times a day (BID) | ORAL | Status: DC

## 2014-02-21 MED ORDER — MORPHINE SULFATE 4 MG/ML IJ SOLN
4.0000 mg | Freq: Once | INTRAMUSCULAR | Status: AC
  Administered 2014-02-21: 4 mg via INTRAVENOUS
  Filled 2014-02-21: qty 1

## 2014-02-21 MED ORDER — POLYETHYLENE GLYCOL 3350 17 G PO PACK: 17.0000 g | PACK | Freq: Every day | ORAL | Status: DC

## 2014-02-21 MED ORDER — SODIUM CHLORIDE 0.9 % IV BOLUS (SEPSIS)
1000.0000 mL | Freq: Once | INTRAVENOUS | Status: AC
  Administered 2014-02-21: 1000 mL via INTRAVENOUS

## 2014-02-21 MED ORDER — HYDROCODONE-ACETAMINOPHEN 5-325 MG PO TABS: 1.0000 | ORAL_TABLET | Freq: Four times a day (QID) | ORAL | Status: DC | PRN

## 2014-02-21 NOTE — ED Notes (Signed)
Pt resting quietly ,  Family at bedside

## 2014-02-21 NOTE — ED Notes (Signed)
Pt reports onset of diffuse abdominal pain this am, 8/10 pressure in nature.  + nausea w/o emesis.

## 2014-02-21 NOTE — ED Provider Notes (Signed)
CSN: [id number]     Arrival date & time 02/21/14  1946 History   First MD Initiated Contact with Patient 02/21/14 2020     Chief Complaint  Patient presents with  . Abdominal Pain     (Consider location/radiation/quality/duration/timing/severity/associated sxs/prior Treatment) HPI Comments: Virginia Morgan is a 58 y.o. female with a PMHx of breast cancer, HTN, and depression/anxiety, and a PSHx of appendectomy and L breast lumpectomy, who presents to the ED with complaints of generalized abd pain that began at 12pm after eating breakfast 2hrs before. Pain is 7/10, pressure-like, constant, nonradiating, with no known aggravating or alleviating factors, unrelieved by zantac 150mg , coming on suddenly. She endorses some mild nausea without vomiting since onset. Also states her back began to become sore a few hours after the onset of the pain, but she attributes this to not being able to get comfortable and trying to lean forward, thus making her back sore. Denies radiation of abd pain to her back. Endorses poor fluid intake today, and states she ate a diner she frequents, had eggs pancakes toast coffee and sausage. States in the past she's had "issues" with sausage, but she's not had issues recently, unsure if this could be the etiology of her symptoms. Last BM was at 1pm which did not change her symptoms, and was her typical BM. Endorses passage of flatus today. Denies fevers, chills, CP, SOB, hemoptysis, jaw/neck/arm pain, LE swelling, diaphoresis, URI symptoms, hematemesis, diarrhea, constipation, obstipation, hematochezia, melena, urinary symptoms, vaginal symptoms, sick contacts, recent travel, myalgias or arthralgias. Denies flank pain.   Patient is a 58 y.o. female presenting with abdominal pain. The history is provided by the patient. No language interpreter was used.  Abdominal Pain Pain location:  Generalized Pain quality: pressure   Pain radiates to:  Does not radiate Pain severity:  Moderate  (7/10) Onset quality:  Sudden Duration:  8 hours Timing:  Constant Progression:  Unchanged Chronicity:  New Context: eating (ate breakfast 2hrs prior to episode) and suspicious food intake (ate sausage at breakfast)   Context: not alcohol use, not diet changes, not recent illness, not recent travel, not retching and not sick contacts   Relieved by:  Nothing Worsened by:  Nothing tried Ineffective treatments:  Antacids (zantac 150mg ) Associated symptoms: nausea   Associated symptoms: no anorexia, no belching, no chest pain, no chills, no constipation, no cough, no diarrhea, no dysuria, no fever, no flatus, no hematemesis, no hematochezia, no hematuria, no melena, no shortness of breath, no sore throat, no vaginal bleeding, no vaginal discharge and no vomiting   Risk factors: obesity   Risk factors: no alcohol abuse and no NSAID use     Past Medical History  Diagnosis Date  . Pneumonia   . Breast cancer     radiation   Past Surgical History  Procedure Laterality Date  . Appendectomy    . Mandible fracture surgery    . Left lumpectomy     Family History  Problem Relation Age of Onset  . Diabetes Mother   . Cancer Mother   . Hypertension Other   . Coronary artery disease Other    History  Substance Use Topics  . Smoking status: Former Research scientist (life sciences)  . Smokeless tobacco: Former Systems developer  . Alcohol Use: Yes     Comment: Occasional Wine   OB History   Grav Para Term Preterm Abortions TAB SAB Ect Mult Living  Review of Systems  Constitutional: Negative for fever and chills.  HENT: Negative for congestion, postnasal drip, rhinorrhea and sore throat.        +mouth dryness  Respiratory: Negative for cough and shortness of breath.   Cardiovascular: Negative for chest pain.  Gastrointestinal: Positive for nausea and abdominal pain. Negative for vomiting, diarrhea, constipation, blood in stool, melena, hematochezia, abdominal distention, rectal pain, anorexia, flatus and  hematemesis.  Genitourinary: Negative for dysuria, urgency, frequency, hematuria, flank pain, decreased urine volume, vaginal bleeding, vaginal discharge, difficulty urinating and pelvic pain.  Musculoskeletal: Positive for back pain (soreness, mild). Negative for arthralgias and myalgias.  Skin: Negative for color change.  Neurological: Negative for dizziness, weakness, numbness and headaches.  Hematological: Negative for adenopathy.    10 Systems reviewed and are negative for acute change except as noted in the HPI.   Allergies  Penicillins; Bee venom; and Sulfa antibiotics  Home Medications   Prior to Admission medications   Medication Sig Start Date End Date Taking? Authorizing Provider  ALPRAZolam Duanne Moron) 0.5 MG tablet Take 0.5 mg by mouth 3 (three) times daily as needed for anxiety.    Historical Provider, MD  atenolol (TENORMIN) 50 MG tablet Take 50 mg by mouth daily.    Historical Provider, MD  cetirizine (ZYRTEC) 10 MG tablet Take 10 mg by mouth daily.    Historical Provider, MD  FLUoxetine (PROZAC) 20 MG tablet Take 60 mg by mouth daily.    Historical Provider, MD  hydrochlorothiazide (HYDRODIURIL) 25 MG tablet Take 25 mg by mouth daily.    Historical Provider, MD  predniSONE (DELTASONE) 20 MG tablet Take 3 tablets (60 mg total) by mouth daily. 10/08/12   Jasper Riling. Pickering, MD  traZODone (DESYREL) 100 MG tablet Take 50-100 mg by mouth at bedtime as needed for sleep.    Historical Provider, MD   BP 154/86  Pulse 60  Temp(Src) 98.7 F (37.1 C) (Oral)  Resp 20  SpO2 100% Physical Exam  Nursing note and vitals reviewed. Constitutional: She is oriented to person, place, and time. Vital signs are normal. She appears well-developed and well-nourished. No distress.  Afebrile, nontoxic, NAD  HENT:  Head: Normocephalic and atraumatic.  Mouth/Throat: Oropharynx is clear and moist. Mucous membranes are dry.  Mildly dry mucous membranes  Eyes: Conjunctivae and EOM are normal.  Right eye exhibits no discharge. Left eye exhibits no discharge.  Neck: Normal range of motion. Neck supple. No JVD present.  Cardiovascular: Normal rate, regular rhythm, normal heart sounds and intact distal pulses.  Exam reveals no gallop and no friction rub.   No murmur heard. RRR, nl s1/s2, no m/r/g  Pulmonary/Chest: Effort normal and breath sounds normal. No respiratory distress. She has no decreased breath sounds. She has no wheezes. She has no rhonchi. She has no rales.  CTAB in all lung fields  Abdominal: Soft. Normal appearance and bowel sounds are normal. She exhibits no distension. There is tenderness in the epigastric area and periumbilical area. There is no rigidity, no rebound, no guarding, no CVA tenderness and negative Murphy's sign.    Obese abdomen which limits exam Soft, nondistended, +BS throughout without abnormal sounds, mildly tender just below epigastrum but above periumbilical area without r/g/r, neg murphy's, no CVA TTP  Musculoskeletal: Normal range of motion.       Lumbar back: She exhibits tenderness (mild bilateral paraspinous muscles).  All spinal levels with no bony midline spinous process TTP, without bony deformity or step offs. Mild lumbar paraspinous  muscle tenderness without spasm, FROM intact. Strength and sensation at baseline. MAE x4. Gait nonantalgic  Neurological: She is alert and oriented to person, place, and time. She has normal strength. No sensory deficit. Gait normal.  Skin: Skin is warm, dry and intact. No rash noted.  Psychiatric: She has a normal mood and affect.    ED Course  Procedures (including critical care time) Labs Review Labs Reviewed  CBC WITH DIFFERENTIAL - Abnormal; Notable for the following:    Neutrophils Relative % 84 (*)    Lymphocytes Relative 11 (*)    All other components within normal limits  COMPREHENSIVE METABOLIC PANEL - Abnormal; Notable for the following:    Glucose, Bld 140 (*)    AST 102 (*)    ALT 51 (*)     GFR calc non Af Amer 68 (*)    GFR calc Af Amer 79 (*)    All other components within normal limits  LIPASE, BLOOD    Imaging Review Dg Abd Acute W/chest  02/21/2014   CLINICAL DATA:  58 year old female with onset of diffuse abdominal pain. Initial encounter.  EXAM: ACUTE ABDOMEN SERIES (ABDOMEN 2 VIEW & CHEST 1 VIEW)  COMPARISON:  10/08/2012 chest x-ray.  FINDINGS: Heart size top-normal.  Central pulmonary vascular prominence unchanged without evidence of pulmonary edema, segmental infiltrate or pneumothorax.  Nonspecific bowel gas pattern without plain film evidence of bowel obstruction or free intraperitoneal air.  Surgical clips left upper quadrant.  IMPRESSION: Nonspecific bowel gas pattern without plain film evidence of bowel obstruction or free intraperitoneal air.  No acute pulmonary abnormality noted.   Electronically Signed   By: Chauncey Cruel M.D.   On: 02/21/2014 22:44     EKG Interpretation None      MDM   Final diagnoses:  Constipation, unspecified constipation type  Nausea  Dehydration  Epigastric abdominal pain    58y/o female with mid-abdominal pain and nausea x8 hours. Has some mild back soreness too from trying to get comfortable. Ate sausage this AM and previously had issues with sausage, but had been eating this without problem x1 yr, but this could be GERD/dyspepsia/gastritis related to food irritation although pain seems lower than epigastrum. Will hold on GI cocktail given that pt was nauseated, although currently declines nausea meds, but don't want to aggravate. Will obtain basic labs and give fluids since pt seems mildly dehydrated. Pt would like to await labs before proceeding with imaging due to financial concern. DDx includes obstruction vs other intraabdominal process therefore could obtain xray but might need CT. Will await labs prior to decision making. Will give pain meds IV. Will reassess shortly.   12:10 AM Pain improved drastically. Lipase WNL. CBC  w/diff unremarkable, CMP showing mildly elevated transaminases, likely related to dehydration, GFR mildly lower than baseline but BUN/Cr WNL. Acute abd series imaging obtained to r/o obstruction since labs were relatively WNL, and found moderate stool burden in R sided colon. No concern for obstruction or perf. Pt feels less dry mouthed after IVFs, and no longer nauseated. PO challenged and pt tolerating PO. Highly doubt ACS or cardiac etiology of pain, doubt need for further imaging or labs. Discussed fluids, fiber, miralax, and zantac use to help with constipation/stool burden and possible gastritis/GERD/peptic ulcer. Antiemetics given for home. Will have her f/up in 1 week with PCP. I explained the diagnosis and have given explicit precautions to return to the ER including for any other new or worsening symptoms. The patient understands and accepts  the medical plan as it's been dictated and I have answered their questions. Discharge instructions concerning home care and prescriptions have been given. The patient is STABLE and is discharged to home in good condition.  BP 118/53  Pulse 53  Temp(Src) 97.6 F (36.4 C) (Oral)  Resp 20  SpO2 93%  Meds ordered this encounter  Medications  . sodium chloride 0.9 % bolus 1,000 mL    Sig:   . morphine 4 MG/ML injection 4 mg    Sig:   . polyethylene glycol (MIRALAX / GLYCOLAX) packet    Sig: Take 17 g by mouth daily.    Dispense:  14 each    Refill:  0    Order Specific Question:  Supervising Provider    Answer:  Noemi Chapel D [[id number]]  . ranitidine (ZANTAC) 150 MG tablet    Sig: Take 1 tablet (150 mg total) by mouth 2 (two) times daily.    Dispense:  30 tablet    Refill:  0    Order Specific Question:  Supervising Provider    Answer:  Noemi Chapel D [[id number]]  . ondansetron (ZOFRAN) 8 MG tablet    Sig: Take 1 tablet (8 mg total) by mouth every 8 (eight) hours as needed for nausea or vomiting.    Dispense:  10 tablet    Refill:  0    Order  Specific Question:  Supervising Provider    Answer:  Noemi Chapel D [[id number]]  . HYDROcodone-acetaminophen (NORCO) 5-325 MG per tablet    Sig: Take 1 tablet by mouth every 6 (six) hours as needed for severe pain.    Dispense:  6 tablet    Refill:  0    Order Specific Question:  Supervising Provider    Answer:  Johnna Acosta [address] [place], PA-C 02/22/14 0151

## 2014-02-21 NOTE — Discharge Instructions (Signed)
Abdominal (belly) pain can be caused by many things. Your caregiver performed an examination and possibly ordered blood/urine tests and imaging (CT scan, x-rays, ultrasound). Your labs were all normal aside from being a little dehydrated. STAY WELL HYDRATED! Your pain might be from constipation, increased the fiber and water in your diet and use miralax until you have regular soft bowel movements; it could be from gastritis or irritated stomach, use zantac twice daily as indicated on the bottle for the next 5-7 days, and then once daily thereafter until symptoms resolve. Avoid NSAIDs like ibuprofen until this resolves. Use norco as needed, as directed for severe pain, but be aware that this causes drowsiness so don't drive, and can cause constipation, so use sparingly. Use zofran as prescribed, as needed for nausea. Stay well hydrated with small sips of fluids throughout the day, ad eat a bland diet without spicy foods to avoid aggravating your pain. Many cases can be observed and treated at home after initial evaluation in the emergency department. Even though you are being discharged home, abdominal pain can be unpredictable. Therefore, you need a repeated exam if your pain does not resolve, returns, or worsens. Most patients with abdominal pain don't have to be admitted to the hospital or have surgery, but serious problems like appendicitis and gallbladder attacks can start out as nonspecific pain. Many abdominal conditions cannot be diagnosed in one visit, so follow-up evaluations are very important. SEEK IMMEDIATE MEDICAL ATTENTION IF YOU DEVELOP ANY OF THE FOLLOWING SYMPTOMS:  The pain does not go away or becomes severe.   A temperature above 101 develops.   Repeated vomiting occurs (multiple episodes).   The pain becomes localized to portions of the abdomen. The right side could possibly be appendicitis. In an adult, the left lower portion of the abdomen could be colitis or diverticulitis.   Blood  is being passed in stools or vomit (bright red or black tarry stools).   Return also if you develop chest pain, difficulty breathing, dizziness or fainting, or become confused, poorly responsive, or inconsolable (young children).  The constipation stays for more than 4 days.   There is belly (abdominal) or rectal pain.   You do not seem to be getting better.    Abdominal Pain, Women Abdominal (stomach, pelvic, or belly) pain can be caused by many things. It is important to tell your doctor:  The location of the pain.  Does it come and go or is it present all the time?  Are there things that start the pain (eating certain foods, exercise)?  Are there other symptoms associated with the pain (fever, nausea, vomiting, diarrhea)? All of this is helpful to know when trying to find the cause of the pain. CAUSES   Stomach: virus or bacteria infection, or ulcer.  Intestine: appendicitis (inflamed appendix), regional ileitis (Crohn's disease), ulcerative colitis (inflamed colon), irritable bowel syndrome, diverticulitis (inflamed diverticulum of the colon), or cancer of the stomach or intestine.  Gallbladder disease or stones in the gallbladder.  Kidney disease, kidney stones, or infection.  Pancreas infection or cancer.  Fibromyalgia (pain disorder).  Diseases of the female organs:  Uterus: fibroid (non-cancerous) tumors or infection.  Fallopian tubes: infection or tubal pregnancy.  Ovary: cysts or tumors.  Pelvic adhesions (scar tissue).  Endometriosis (uterus lining tissue growing in the pelvis and on the pelvic organs).  Pelvic congestion syndrome (female organs filling up with blood just before the menstrual period).  Pain with the menstrual period.  Pain with ovulation (  producing an egg).  Pain with an IUD (intrauterine device, birth control) in the uterus.  Cancer of the female organs.  Functional pain (pain not caused by a disease, may improve without  treatment).  Psychological pain.  Depression. DIAGNOSIS  Your doctor will decide the seriousness of your pain by doing an examination.  Blood tests.  X-rays.  Ultrasound.  CT scan (computed tomography, special type of X-ray).  MRI (magnetic resonance imaging).  Cultures, for infection.  Barium enema (dye inserted in the large intestine, to better view it with X-rays).  Colonoscopy (looking in intestine with a lighted tube).  Laparoscopy (minor surgery, looking in abdomen with a lighted tube).  Major abdominal exploratory surgery (looking in abdomen with a large incision). TREATMENT  The treatment will depend on the cause of the pain.   Many cases can be observed and treated at home.  Over-the-counter medicines recommended by your caregiver.  Prescription medicine.  Antibiotics, for infection.  Birth control pills, for painful periods or for ovulation pain.  Hormone treatment, for endometriosis.  Nerve blocking injections.  Physical therapy.  Antidepressants.  Counseling with a psychologist or psychiatrist.  Minor or major surgery. HOME CARE INSTRUCTIONS   Do not take laxatives, unless directed by your caregiver.  Take over-the-counter pain medicine only if ordered by your caregiver. Do not take aspirin because it can cause an upset stomach or bleeding.  Try a clear liquid diet (broth or water) as ordered by your caregiver. Slowly move to a bland diet, as tolerated, if the pain is related to the stomach or intestine.  Have a thermometer and take your temperature several times a day, and record it.  Bed rest and sleep, if it helps the pain.  Avoid sexual intercourse, if it causes pain.  Avoid stressful situations.  Keep your follow-up appointments and tests, as your caregiver orders.  If the pain does not go away with medicine or surgery, you may try:  Acupuncture.  Relaxation exercises (yoga, meditation).  Group therapy.  Counseling. SEEK  MEDICAL CARE IF:   You notice certain foods cause stomach pain.  Your home care treatment is not helping your pain.  You need stronger pain medicine.  You want your IUD removed.  You feel faint or lightheaded.  You develop nausea and vomiting.  You develop a rash.  You are having side effects or an allergy to your medicine. SEEK IMMEDIATE MEDICAL CARE IF:   Your pain does not go away or gets worse.  You have a fever.  Your pain is felt only in portions of the abdomen. The right side could possibly be appendicitis. The left lower portion of the abdomen could be colitis or diverticulitis.  You are passing blood in your stools (bright red or black tarry stools, with or without vomiting).  You have blood in your urine.  You develop chills, with or without a fever.  You pass out. MAKE SURE YOU:   Understand these instructions.  Will watch your condition.  Will get help right away if you are not doing well or get worse. Document Released: 02/20/2007 Document Revised: 09/09/2013 Document Reviewed: 03/12/2009 [facility] Patient Information 2015 [place], [place]. This information is not intended to replace advice given to you by your health care provider. Make sure you discuss any questions you have with your health care provider.  Constipation Constipation is when a person:  Poops (has a bowel movement) less than 3 times a week.  Has a hard time pooping.  Has poop  that is dry, hard, or bigger than normal. HOME CARE   Eat foods with a lot of fiber in them. This includes fruits, vegetables, beans, and whole grains such as brown rice.  Avoid fatty foods and foods with a lot of sugar. This includes french fries, hamburgers, cookies, candy, and soda.  If you are not getting enough fiber from food, take products with added fiber in them (supplements).  Drink enough fluid to keep your pee (urine) clear or pale yellow.  Exercise on a regular basis, or as told by your  doctor.  Go to the restroom when you feel like you need to poop. Do not hold it.  Only take medicine as told by your doctor. Do not take medicines that help you poop (laxatives) without talking to your doctor first. GET HELP RIGHT AWAY IF:   You have bright red blood in your poop (stool).  Your constipation lasts more than 4 days or gets worse.  You have belly (abdominal) or butt (rectal) pain.  You have thin poop (as thin as a pencil).  You lose weight, and it cannot be explained. MAKE SURE YOU:   Understand these instructions.  Will watch your condition.  Will get help right away if you are not doing well or get worse. Document Released: 10/12/2007 Document Revised: 04/30/2013 Document Reviewed: 02/04/2013 [facility] Patient Information 2015 [place], [place]. This information is not intended to replace advice given to you by your health care provider. Make sure you discuss any questions you have with your health care provider.  Dehydration, Adult Dehydration is when you lose more fluids from the body than you take in. Vital organs like the kidneys, brain, and heart cannot function without a proper amount of fluids and salt. Any loss of fluids from the body can cause dehydration.  CAUSES   Vomiting.  Diarrhea.  Excessive sweating.  Excessive urine output.  Fever. SYMPTOMS  Mild dehydration  Thirst.  Dry lips.  Slightly dry mouth. Moderate dehydration  Very dry mouth.  Sunken eyes.  Skin does not bounce back quickly when lightly pinched and released.  Dark urine and decreased urine production.  Decreased tear production.  Headache. Severe dehydration  Very dry mouth.  Extreme thirst.  Rapid, weak pulse (more than 100 beats per minute at rest).  Cold hands and feet.  Not able to sweat in spite of heat and temperature.  Rapid breathing.  Blue lips.  Confusion and lethargy.  Difficulty being awakened.  Minimal urine production.  No  tears. DIAGNOSIS  Your caregiver will diagnose dehydration based on your symptoms and your exam. Blood and urine tests will help confirm the diagnosis. The diagnostic evaluation should also identify the cause of dehydration. TREATMENT  Treatment of mild or moderate dehydration can often be done at home by increasing the amount of fluids that you drink. It is best to drink small amounts of fluid more often. Drinking too much at one time can make vomiting worse. Refer to the home care instructions below. Severe dehydration needs to be treated at the hospital where you will probably be given intravenous (IV) fluids that contain water and electrolytes. HOME CARE INSTRUCTIONS   Ask your caregiver about specific rehydration instructions.  Drink enough fluids to keep your urine clear or pale yellow.  Drink small amounts frequently if you have nausea and vomiting.  Eat as you normally do.  Avoid:  Foods or drinks high in sugar.  Carbonated drinks.  Juice.  Extremely hot or cold fluids.  Drinks with caffeine.  Fatty, greasy foods.  Alcohol.  Tobacco.  Overeating.  Gelatin desserts.  Wash your hands well to avoid spreading bacteria and viruses.  Only take over-the-counter or prescription medicines for pain, discomfort, or fever as directed by your caregiver.  Ask your caregiver if you should continue all prescribed and over-the-counter medicines.  Keep all follow-up appointments with your caregiver. SEEK MEDICAL CARE IF:  You have abdominal pain and it increases or stays in one area (localizes).  You have a rash, stiff neck, or severe headache.  You are irritable, sleepy, or difficult to awaken.  You are weak, dizzy, or extremely thirsty. SEEK IMMEDIATE MEDICAL CARE IF:   You are unable to keep fluids down or you get worse despite treatment.  You have frequent episodes of vomiting or diarrhea.  You have blood or green matter (bile) in your vomit.  You have blood in  your stool or your stool looks black and tarry.  You have not urinated in 6 to 8 hours, or you have only urinated a small amount of very dark urine.  You have a fever.  You faint. MAKE SURE YOU:   Understand these instructions.  Will watch your condition.  Will get help right away if you are not doing well or get worse. Document Released: 04/25/2005 Document Revised: 07/18/2011 Document Reviewed: 12/13/2010 [facility] Patient Information 2015 [place], [place]. This information is not intended to replace advice given to you by your health care provider. Make sure you discuss any questions you have with your health care provider.  Nausea, Adult Nausea is the feeling that you have an upset stomach or have to vomit. Nausea by itself is not likely a serious concern, but it may be an early sign of more serious medical problems. As nausea gets worse, it can lead to vomiting. If vomiting develops, there is the risk of dehydration.  CAUSES   Viral infections.  Food poisoning.  Medicines.  Pregnancy.  Motion sickness.  Migraine headaches.  Emotional distress.  Severe pain from any source.  Alcohol intoxication. HOME CARE INSTRUCTIONS  Get plenty of rest.  Ask your caregiver about specific rehydration instructions.  Eat small amounts of food and sip liquids more often.  Take all medicines as told by your caregiver. SEEK MEDICAL CARE IF:  You have not improved after 2 days, or you get worse.  You have a headache. SEEK IMMEDIATE MEDICAL CARE IF:   You have a fever.  You faint.  You keep vomiting or have blood in your vomit.  You are extremely weak or dehydrated.  You have dark or bloody stools.  You have severe chest or abdominal pain. MAKE SURE YOU:  Understand these instructions.  Will watch your condition.  Will get help right away if you are not doing well or get worse. Document Released: 06/02/2004 Document Revised: 01/18/2012 Document Reviewed:  01/05/2011 [facility] Patient Information 2015 [place], [place]. This information is not intended to replace advice given to you by your health care provider. Make sure you discuss any questions you have with your health care provider. Emergency Department Resource Guide 1) Find a Doctor and Pay Out of Pocket Although you won't have to find out who is covered by your insurance plan, it is a good idea to ask around and get recommendations. You will then need to call the office and see if the doctor you have chosen will accept you as a new patient and what types of options they offer for patients who  are self-pay. Some doctors offer discounts or will set up payment plans for their patients who do not have insurance, but you will need to ask so you aren't surprised when you get to your appointment.  2) Contact Your Local Health Department Not all health departments have doctors that can see patients for sick visits, but many do, so it is worth a call to see if yours does. If you don't know where your local health department is, you can check in your phone book. The CDC also has a tool to help you locate your state's health department, and many state websites also have listings of all of their local health departments.  3) Find a [facility] If your illness is not likely to be very severe or complicated, you may want to try a walk in clinic. These are popping up all over the country in pharmacies, drugstores, and shopping centers. They're usually staffed by nurse practitioners or physician assistants that have been trained to treat common illnesses and complaints. They're usually fairly quick and inexpensive. However, if you have serious medical issues or chronic medical problems, these are probably not your best option.  No Primary Care Doctor: - Call [facility] Connect at  [phone] - they can help you locate a primary care doctor that  accepts your insurance, provides certain services, etc. - Physician  Referral Service- [phone]  Chronic Pain Problems: Organization         Address  Phone   Notes  [facility]  [phone] Patients need to be referred by their primary care doctor.   Medication Assistance: Organization         Address  Phone   Notes  [facility] Medication [facility] [address] [place], [place] [address] [phone] --Must be a resident of [facility] -- Must have NO insurance coverage whatsoever (no Medicaid/ Medicare, etc.) -- The pt. MUST have a primary care doctor that directs their care regularly and follows them in the community   [facility]  [phone]   [employer]  [phone]    Agencies that provide inexpensive medical care: Organization         Address  Phone   Notes  [place]  [phone]   [facility]    [phone]   [facility] [place], [place] [address] [phone]   [place] [address], [place] [phone]   [facility]    [phone]   [facility]    [phone]   [place] and [place] [address], [place] Phone:  [phone], Fax:  [phone] Hours of Operation:  9 am - 6 pm, M-F.  Also accepts Medicaid/Medicare and self-pay.  [facility] for [address], [address], [place] Phone: [phone], Fax: [phone]. Hours of Operation:  8:30 am - 5:30 pm, M-F.  Also accepts Medicaid and self-pay.  [facility] [address] [place] Phone: [phone]   [place], [place], [place] [phone], Ext. [phone] Mondays & Thursdays: 7-9 AM.  First 15 patients are seen on a first come, first serve basis.    [place] Providers:  Organization         Address  Phone   Notes  [facility] [address], [place], [place] [phone] Also accepts self-pay patients.  [facility] [address], [place]  [phone]   [place], [address], [place] [phone]   [facility] [address] [place] [phone]   Lucianne Lei [address], [place]   [phone] Only accepts [place] patients after they have their name applied to their card.   Self-Pay (no insurance) in [facility]:  Organization         Address  Phone   Notes  Sickle Cell Patients, [facility] [place] [phone]   [facility] [place] [phone]   [facility] [place]  [place], [place], [place] [phone]   [facility]/Dr. Osei-Bonsu  [address] [place] or [address], [place] [phone] Phone number for both [place] and [place] locations is the same.  [facility] [address] [place] [phone]   [facility] [address] [place] or [address] [phone]   [facility] [address] [place] [phone], phone; [phone], fax Sees patients 1st and 3rd Saturday of every month.  Must not qualify for public or private insurance (i.e. Medicaid, Medicare, [place] Health Choice, Veterans' Benefits)  Household income should be no more than 200% of the poverty level The clinic cannot treat you if you are pregnant or think you are pregnant  Sexually transmitted diseases are not treated at the clinic.

## 2014-02-22 NOTE — ED Provider Notes (Signed)
Medical screening examination/treatment/procedure(s) were performed by non-physician practitioner and as supervising physician I was immediately available for consultation/collaboration.  Richarda Blade, MD 02/22/14 [phone]

## 2014-02-24 ENCOUNTER — Other Ambulatory Visit (HOSPITAL_COMMUNITY): Payer: Self-pay | Admitting: Obstetrics & Gynecology

## 2014-03-31 ENCOUNTER — Other Ambulatory Visit (HOSPITAL_COMMUNITY): Payer: Self-pay | Admitting: Obstetrics & Gynecology

## 2014-03-31 DIAGNOSIS — Z1231 Encounter for screening mammogram for malignant neoplasm of breast: Secondary | ICD-10-CM

## 2014-04-21 ENCOUNTER — Ambulatory Visit (HOSPITAL_COMMUNITY): Payer: Self-pay | Attending: Obstetrics & Gynecology

## 2014-04-28 ENCOUNTER — Ambulatory Visit (HOSPITAL_COMMUNITY): Payer: Self-pay | Attending: Obstetrics & Gynecology

## 2014-05-15 ENCOUNTER — Ambulatory Visit (HOSPITAL_COMMUNITY)
Admission: RE | Admit: 2014-05-15 | Discharge: 2014-05-15 | Disposition: A | Discharge status: Home / Self Care | Payer: Self-pay | Source: Ambulatory Visit | Attending: Obstetrics & Gynecology | Admitting: Obstetrics & Gynecology

## 2014-05-15 DIAGNOSIS — Z1231 Encounter for screening mammogram for malignant neoplasm of breast: Secondary | ICD-10-CM

## 2014-10-27 ENCOUNTER — Other Ambulatory Visit: Payer: Self-pay | Admitting: *Deleted

## 2016-07-15 DIAGNOSIS — I10 Essential (primary) hypertension: Secondary | ICD-10-CM | POA: Diagnosis not present

## 2016-08-02 DIAGNOSIS — I83813 Varicose veins of bilateral lower extremities with pain: Secondary | ICD-10-CM | POA: Diagnosis not present

## 2016-08-02 DIAGNOSIS — Z0001 Encounter for general adult medical examination with abnormal findings: Secondary | ICD-10-CM | POA: Diagnosis not present

## 2016-08-02 DIAGNOSIS — M7631 Iliotibial band syndrome, right leg: Secondary | ICD-10-CM | POA: Diagnosis not present

## 2016-08-04 DIAGNOSIS — I824Z1 Acute embolism and thrombosis of unspecified deep veins of right distal lower extremity: Secondary | ICD-10-CM | POA: Diagnosis not present

## 2016-08-04 DIAGNOSIS — I8001 Phlebitis and thrombophlebitis of superficial vessels of right lower extremity: Secondary | ICD-10-CM | POA: Diagnosis not present

## 2016-08-04 DIAGNOSIS — I82411 Acute embolism and thrombosis of right femoral vein: Secondary | ICD-10-CM | POA: Diagnosis not present

## 2016-08-31 DIAGNOSIS — I82411 Acute embolism and thrombosis of right femoral vein: Secondary | ICD-10-CM | POA: Diagnosis not present

## 2016-08-31 DIAGNOSIS — I8001 Phlebitis and thrombophlebitis of superficial vessels of right lower extremity: Secondary | ICD-10-CM | POA: Diagnosis not present

## 2016-10-18 DIAGNOSIS — Z1231 Encounter for screening mammogram for malignant neoplasm of breast: Secondary | ICD-10-CM | POA: Diagnosis not present

## 2016-10-18 DIAGNOSIS — Z01419 Encounter for gynecological examination (general) (routine) without abnormal findings: Secondary | ICD-10-CM | POA: Diagnosis not present

## 2016-10-25 ENCOUNTER — Other Ambulatory Visit: Payer: Self-pay

## 2016-10-25 DIAGNOSIS — Z86718 Personal history of other venous thrombosis and embolism: Secondary | ICD-10-CM

## 2016-11-03 DIAGNOSIS — I8001 Phlebitis and thrombophlebitis of superficial vessels of right lower extremity: Secondary | ICD-10-CM | POA: Diagnosis not present

## 2016-11-03 DIAGNOSIS — I824Z1 Acute embolism and thrombosis of unspecified deep veins of right distal lower extremity: Secondary | ICD-10-CM | POA: Diagnosis not present

## 2016-11-03 DIAGNOSIS — I8311 Varicose veins of right lower extremity with inflammation: Secondary | ICD-10-CM | POA: Diagnosis not present

## 2016-11-14 ENCOUNTER — Ambulatory Visit (HOSPITAL_COMMUNITY)
Admission: RE | Admit: 2016-11-14 | Discharge: 2016-11-14 | Disposition: A | Discharge status: Home / Self Care | Payer: 59 | Source: Ambulatory Visit | Attending: Vascular Surgery | Admitting: Vascular Surgery

## 2016-11-14 ENCOUNTER — Encounter: Payer: Self-pay | Admitting: Vascular Surgery

## 2016-11-14 ENCOUNTER — Ambulatory Visit (INDEPENDENT_AMBULATORY_CARE_PROVIDER_SITE_OTHER): Payer: 59 | Admitting: Vascular Surgery

## 2016-11-14 VITALS — BP 165/97 | HR 59 | Temp 98.1°F | Resp 18 | Ht 64.0 in | Wt 360.0 lb

## 2016-11-14 DIAGNOSIS — Z86718 Personal history of other venous thrombosis and embolism: Secondary | ICD-10-CM | POA: Diagnosis not present

## 2016-11-14 DIAGNOSIS — I83891 Varicose veins of right lower extremities with other complications: Secondary | ICD-10-CM | POA: Diagnosis not present

## 2016-11-14 NOTE — Progress Notes (Signed)
Subjective:     Patient ID: Virginia Morgan, female   DOB: [birth year], 61 y.o.   MRN: [id number]  HPI 61 year old female was referred by Juanda Chance nurse practitioner for Dr. Dory Horn. Patient has a history of superficial thrombophlebitis in the right calf and possible DVT several months ago. She was evaluated by Dr. Linus Mako [place] vein and was started on anticoagulation-Xarelto. She has returned on several occasions with multiple ultrasounds being performed. She has very large legs and does not wear elastic compression stockings. 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 She does have significant swelling in the right leg which she has had for the past several years but has worsening this year. She continues to have aching throbbing and burning discomfort as well as a progressive edema in the right leg. She has no symptoms in the contralateral left leg  Past Medical History:  Diagnosis Date  . Breast cancer ([place])    radiation  . Pneumonia     Social History  Substance Use Topics  . Smoking status: Former Research scientist (life sciences)  . Smokeless tobacco: Former Systems developer  . Alcohol use Yes     Comment: Occasional Wine    Family History  Problem Relation Age of Onset  . Diabetes Mother   . Cancer Mother   . Hypertension Other   . Coronary artery disease Other     Allergies  Allergen Reactions  . Penicillins Other (See Comments)    Swelling, Whelps  . Bee Venom Swelling    Extreme all over swelling  . Sulfa Antibiotics Hives    Possible hives,  unsure     Current Outpatient Prescriptions:  .  ALPRAZolam (XANAX) 0.5 MG tablet, Take 0.5 mg by mouth 3 (three) times daily as needed for anxiety., Disp: , Rfl:  .  atenolol (TENORMIN) 50 MG tablet, Take 50 mg by mouth daily., Disp: , Rfl:  .  buPROPion (WELLBUTRIN SR) 100 MG 12 hr tablet, Take 7.5 mg by mouth daily. , Disp: , Rfl:  .  hydrochlorothiazide (HYDRODIURIL) 25 MG tablet, Take 25 mg by mouth daily., Disp: ,  Rfl:  .  HYDROcodone-acetaminophen (NORCO) 5-325 MG per tablet, Take 1 tablet by mouth every 6 (six) hours as needed for severe pain., Disp: 6 tablet, Rfl: 0 .  oxcarbazepine (TRILEPTAL) 600 MG tablet, , Disp: , Rfl:  .  XARELTO 20 MG TABS tablet, , Disp: , Rfl:  .  FLUoxetine (PROZAC) 20 MG tablet, Take 80 mg by mouth daily. , Disp: , Rfl:  .  Multiple Vitamin (MULTIVITAMIN WITH MINERALS) TABS tablet, Take 1 tablet by mouth daily., Disp: , Rfl:  .  ondansetron (ZOFRAN) 8 MG tablet, Take 1 tablet (8 mg total) by mouth every 8 (eight) hours as needed for nausea or vomiting. (Patient not taking: Reported on 11/14/2016), Disp: 10 tablet, Rfl: 0 .  polyethylene glycol (MIRALAX / GLYCOLAX) packet, Take 17 g by mouth daily. (Patient not taking: Reported on 11/14/2016), Disp: 14 each, Rfl: 0 .  ranitidine (ZANTAC) 150 MG tablet, Take 1 tablet (150 mg total) by mouth 2 (two) times daily. (Patient not taking: Reported on 11/14/2016), Disp: 30 tablet, Rfl: 0 .  vitamin E 400 UNIT capsule, Take 400 Units by mouth daily., Disp: , Rfl:   Vitals:   11/14/16 1117 11/14/16 1127  BP: (!) 164/94 (!) 165/97  Pulse: (!) 59   Resp: 18   Temp: 98.1 F (36.7 C)   TempSrc: Oral  SpO2: 95%   Weight: (!) 360 lb (163.3 kg)   Height: 5\' 4"  (1.626 m)     Body mass index is 61.79 kg/m.         Review of Systems denies chest pain. Is unable to ambulate more than 1-2 blocks because of leg pain. Has history of breast cancer. See history of present illness Past Medical History:  Diagnosis Date  . Breast cancer ([place])    radiation  . Pneumonia     Social History  Substance Use Topics  . Smoking status: Former Research scientist (life sciences)  . Smokeless tobacco: Former Systems developer  . Alcohol use Yes     Comment: Occasional Wine    Family History  Problem Relation Age of Onset  . Diabetes Mother   . Cancer Mother   . Hypertension Other   . Coronary artery disease Other     Allergies  Allergen Reactions  . Penicillins Other (See  Comments)    Swelling, Whelps  . Bee Venom Swelling    Extreme all over swelling  . Sulfa Antibiotics Hives    Possible hives,  unsure     Current Outpatient Prescriptions:  .  ALPRAZolam (XANAX) 0.5 MG tablet, Take 0.5 mg by mouth 3 (three) times daily as needed for anxiety., Disp: , Rfl:  .  atenolol (TENORMIN) 50 MG tablet, Take 50 mg by mouth daily., Disp: , Rfl:  .  buPROPion (WELLBUTRIN SR) 100 MG 12 hr tablet, Take 7.5 mg by mouth daily. , Disp: , Rfl:  .  hydrochlorothiazide (HYDRODIURIL) 25 MG tablet, Take 25 mg by mouth daily., Disp: , Rfl:  .  HYDROcodone-acetaminophen (NORCO) 5-325 MG per tablet, Take 1 tablet by mouth every 6 (six) hours as needed for severe pain., Disp: 6 tablet, Rfl: 0 .  oxcarbazepine (TRILEPTAL) 600 MG tablet, , Disp: , Rfl:  .  XARELTO 20 MG TABS tablet, , Disp: , Rfl:  .  FLUoxetine (PROZAC) 20 MG tablet, Take 80 mg by mouth daily. , Disp: , Rfl:  .  Multiple Vitamin (MULTIVITAMIN WITH MINERALS) TABS tablet, Take 1 tablet by mouth daily., Disp: , Rfl:  .  ondansetron (ZOFRAN) 8 MG tablet, Take 1 tablet (8 mg total) by mouth every 8 (eight) hours as needed for nausea or vomiting. (Patient not taking: Reported on 11/14/2016), Disp: 10 tablet, Rfl: 0 .  polyethylene glycol (MIRALAX / GLYCOLAX) packet, Take 17 g by mouth daily. (Patient not taking: Reported on 11/14/2016), Disp: 14 each, Rfl: 0 .  ranitidine (ZANTAC) 150 MG tablet, Take 1 tablet (150 mg total) by mouth 2 (two) times daily. (Patient not taking: Reported on 11/14/2016), Disp: 30 tablet, Rfl: 0 .  vitamin E 400 UNIT capsule, Take 400 Units by mouth daily., Disp: , Rfl:   Vitals:   11/14/16 1117 11/14/16 1127  BP: (!) 164/94 (!) 165/97  Pulse: (!) 59   Resp: 18   Temp: 98.1 F (36.7 C)   TempSrc: Oral   SpO2: 95%   Weight: (!) 360 lb (163.3 kg)   Height: 5\' 4"  (1.626 m)     Body mass index is 61.79 kg/m.           Objective:   Physical Exam BP (!) 165/97 (BP Location: Right Arm,  Patient Position: Sitting, Cuff Size: Normal) Comment: recheck  Pulse (!) 59   Temp 98.1 F (36.7 C) (Oral)   Resp 18   Ht 5\' 4"  (1.626 m)   Wt (!) 360 lb (163.3 kg)  SpO2 95%   BMI 61.79 kg/m     Gen.-alert and oriented x3 in no apparent distress-morbidly obese HEENT normal for age Lungs no rhonchi or wheezing Cardiovascular regular rhythm no murmurs carotid pulses 3+ palpable no bruits audible Abdomen soft nontender no palpable masses-obese Musculoskeletal free of  major deformities Skin clear -no rashes Neurologic normal Lower extremities 3+ femoral and dorsalis pedis pulses palpable bilaterally with no edema on the left and 2+ edema on the right. Palpable thrombosed varicosities in the right medial calf over the great saphenous system  Today I ordered a venous duplex exam which I reviewed and interpreted. There is no DVT at this time. There is gross reflux throughout and large right great saphenous vein supplying these varicosities. There is also enlargement of the right small saphenous vein.  Today I also performed a bedside SonoSite ultrasound exam which revealed gross reflux throughout a large right great saphenous vein supplying these painful varicosities     Assessment:     #1 painful varicosities right leg due to gross reflux right great saphenous vein having cause superficial thrombophlebitis in the right calf and currently causing symptoms which are affecting patient's daily living including pain and significant swelling #2 possible history of previous DVT-not present currently-currently onXarelto #3 morbid obesity #4 history of breast cancer    Plan:         #1 long leg elastic compression stockings 20-30 mm gradient #2 elevate legs as much as possible #3 ibuprofen daily on a regular basis for pain #4 return in 3 months-it is unlikely that she will be able to wear elastic compression stockings because of the very large circumference in her right thigh that  she will attempt this. In 3 months unless there is dramatic improvement in her chronic swelling and pain she will need laser ablation right great saphenous vein. This will hopefully improve her edema somewhat and decrease the likelihood of further episodes of superficial thrombophlebitis Return in 3 months if she would like for [place] to perform this procedure and we will submit for precertification at that time

## 2016-12-27 ENCOUNTER — Telehealth: Payer: Self-pay | Admitting: *Deleted

## 2016-12-27 NOTE — Telephone Encounter (Signed)
Left detailed telephone voice message responding to Mrs. Lutes's earlier telephone call requesting medication advice regarding Xarelto.  Mrs. Sherrard was seen by Dr. Kellie Simmering on 11-14-2016 and venous reflux study done on 11-14-2016 which demonstrated no DVT.  Prior to  11-14-2016, Mrs. Mowers had been under care of Dr. Renaldo Reel for superficial thrombophlebitis in right calf and possible DVT and had been on Xarelto.  Today, Mrs. Quain earlier phone call was regarding whether to continue Xarelto as the prescription she had received from Dr. Renaldo Reel will be completed on 01-02-2017 and she is no longer under his care.  Discussed Mrs. Benecke's question regarding Xarelto with Dr. Kellie Simmering. He reviewed all clinical records and ultrasound studies.  He recommended that Mrs. Bibby stop Xarelto when her current prescription finishes on 01-02-2017 and then to start Aspirin 325 mg once daily.  Left detailed telephone message with Mrs. Loftus with Dr. Evelena Leyden specific instructions.

## 2017-01-12 ENCOUNTER — Telehealth: Payer: Self-pay | Admitting: *Deleted

## 2017-01-12 NOTE — Telephone Encounter (Signed)
Pt called in worried because she is having increased swelling in her right leg since coming off Xarelto. She is on aspirin 325mg  daily. She wonders if coming off the Xarelto, is that causing the swelling. She denies leg pain, swelling of ankle and foot. Told her this does not sound like a DVT, but rather, progression of her venous insuf. She is seeing [facility] in Oct for a three month fu appt. Suggested stockings (which she can not wear), elevation and Ibuprofen if there is pain. Cautioned her on blood thinning too much if she takes Ibuprofen since she is on Aspirin too. Pt expressed understanding. We will see her in Oct. Follow prn.

## 2017-01-17 ENCOUNTER — Encounter: Payer: Self-pay | Admitting: Vascular Surgery

## 2017-01-17 ENCOUNTER — Encounter (HOSPITAL_COMMUNITY): Payer: Self-pay

## 2017-02-21 ENCOUNTER — Ambulatory Visit: Payer: 59 | Admitting: Vascular Surgery

## 2017-02-28 ENCOUNTER — Encounter: Payer: Self-pay | Admitting: Vascular Surgery

## 2017-02-28 ENCOUNTER — Ambulatory Visit (INDEPENDENT_AMBULATORY_CARE_PROVIDER_SITE_OTHER): Payer: 59 | Admitting: Vascular Surgery

## 2017-02-28 VITALS — BP 144/91 | HR 76 | Temp 97.5°F | Resp 14 | Ht 64.0 in | Wt 355.0 lb

## 2017-02-28 DIAGNOSIS — I83891 Varicose veins of right lower extremities with other complications: Secondary | ICD-10-CM

## 2017-02-28 NOTE — Progress Notes (Signed)
Subjective:     Patient ID: Virginia Morgan, female   DOB: 1956/01/19, 61 y.o.   MRN: [id number]  HPI This 61 year old female returns for 3 month follow-up regarding her chronic swelling and pain in the right lower extremity. She has a history of superficial thrombophlebitis in the past and was seen by Dr. Cresenciano Lick treated briefly with Xarelto which we discontinued a few months ago when the superficial thrombophlebitis resolved. Patient had gross reflux in the right great saphenous vein. She has been trying long-leg elastic compression stockings which have been impossible to manage because of the large size of her thighs. The swelling has been stable. She is currently taking one aspirin per day.  Past Medical History:  Diagnosis Date  . Breast cancer ([place])    radiation  . Pneumonia     Social History  Substance Use Topics  . Smoking status: Former Research scientist (life sciences)  . Smokeless tobacco: Former Systems developer  . Alcohol use Yes     Comment: Occasional Wine    Family History  Problem Relation Age of Onset  . Diabetes Mother   . Cancer Mother   . Hypertension Other   . Coronary artery disease Other     Allergies  Allergen Reactions  . Penicillins Other (See Comments)    Swelling, Whelps  . Bee Venom Swelling    Extreme all over swelling  . Sulfa Antibiotics Hives    Possible hives,  unsure     Current Outpatient Prescriptions:  .  ALPRAZolam (XANAX) 0.5 MG tablet, Take 0.5 mg by mouth 3 (three) times daily as needed for anxiety., Disp: , Rfl:  .  aspirin 325 MG tablet, Take 325 mg by mouth daily., Disp: , Rfl:  .  atenolol (TENORMIN) 50 MG tablet, Take 50 mg by mouth daily., Disp: , Rfl:  .  buPROPion (WELLBUTRIN SR) 100 MG 12 hr tablet, Take 7.5 mg by mouth daily. , Disp: , Rfl:  .  hydrochlorothiazide (HYDRODIURIL) 25 MG tablet, Take 25 mg by mouth daily., Disp: , Rfl:  .  oxcarbazepine (TRILEPTAL) 600 MG tablet, , Disp: , Rfl:  .  FLUoxetine (PROZAC) 20 MG tablet, Take 80 mg by mouth  daily. , Disp: , Rfl:  .  HYDROcodone-acetaminophen (NORCO) 5-325 MG per tablet, Take 1 tablet by mouth every 6 (six) hours as needed for severe pain. (Patient not taking: Reported on 02/28/2017), Disp: 6 tablet, Rfl: 0 .  Multiple Vitamin (MULTIVITAMIN WITH MINERALS) TABS tablet, Take 1 tablet by mouth daily., Disp: , Rfl:  .  ondansetron (ZOFRAN) 8 MG tablet, Take 1 tablet (8 mg total) by mouth every 8 (eight) hours as needed for nausea or vomiting. (Patient not taking: Reported on 02/28/2017), Disp: 10 tablet, Rfl: 0 .  polyethylene glycol (MIRALAX / GLYCOLAX) packet, Take 17 g by mouth daily. (Patient not taking: Reported on 02/28/2017), Disp: 14 each, Rfl: 0 .  ranitidine (ZANTAC) 150 MG tablet, Take 1 tablet (150 mg total) by mouth 2 (two) times daily. (Patient not taking: Reported on 02/28/2017), Disp: 30 tablet, Rfl: 0 .  vitamin E 400 UNIT capsule, Take 400 Units by mouth daily., Disp: , Rfl:  .  XARELTO 20 MG TABS tablet, , Disp: , Rfl:   Vitals:   02/28/17 1355  BP: (!) 144/91  Pulse: 76  Resp: 14  Temp: (!) 97.5 F (36.4 C)  SpO2: 100%  Weight: (!) 355 lb (161 kg)  Height: 5\' 4"  (1.626 m)    Body mass index is  60.94 kg/m.         Review of Systems Morbid obesity, denies chest pain, dyspnea on exertion, PND, orthopnea, hemoptysis    Objective:   Physical Exam BP (!) 144/91 (BP Location: Right Arm, Patient Position: Sitting, Cuff Size: Large)   Pulse 76   Temp (!) 97.5 F (36.4 C)   Resp 14   Ht 5\' 4"  (1.626 m)   Wt (!) 355 lb (161 kg)   SpO2 100%   BMI 60.94 kg/m   Gen. a morbidly obese female no apparent distress alert and oriented 3 Lungs no rhonchi or wheezing Right leg with chronic 1+ edema primarily below the knee. No bulging varicosities noted. Early hyperpigmentation distally.  Today I performed a bedside SonoSite ultrasound exam of the right leg. The great saphenous vein is noncompressible from the mid thigh distally with appearance of chronic  thrombus within the lumen. This is a significant change from the last visit. There is a short segment of the great saphenous vein open proximally with reflux.     Assessment:     History of superficial thrombophlebitis right leg with chronic pain and swelling with interval occlusion of right great saphenous system Morbid obesity Chronic pain and swelling right leg    Plan:     No indication for an intervention since great saphenous vein is essentially occluded throughout Elevate foot of bed 2-3 inches Short leg elastic compression stockings 20-30 mm gradient to wear on a chronic basis No further suggestions

## 2017-04-26 ENCOUNTER — Emergency Department (HOSPITAL_COMMUNITY)
Admission: EM | Admit: 2017-04-26 | Discharge: 2017-04-26 | Disposition: A | Discharge status: Home / Self Care | Payer: 59 | Attending: Emergency Medicine | Admitting: Emergency Medicine

## 2017-04-26 ENCOUNTER — Encounter (HOSPITAL_COMMUNITY): Payer: Self-pay | Admitting: *Deleted

## 2017-04-26 DIAGNOSIS — Z5321 Procedure and treatment not carried out due to patient leaving prior to being seen by health care provider: Secondary | ICD-10-CM | POA: Diagnosis not present

## 2017-04-26 DIAGNOSIS — M545 Low back pain: Secondary | ICD-10-CM | POA: Diagnosis present

## 2017-04-26 DIAGNOSIS — R1030 Lower abdominal pain, unspecified: Secondary | ICD-10-CM | POA: Diagnosis not present

## 2017-04-26 HISTORY — DX: Acute embolism and thrombosis of unspecified deep veins of unspecified lower extremity: I82.409

## 2017-04-26 HISTORY — DX: Essential (primary) hypertension: I10

## 2017-04-26 NOTE — ED Triage Notes (Signed)
Patient left per registration clerk

## 2017-04-26 NOTE — ED Triage Notes (Signed)
Right lower back pain that started radiating to the right groin area about 3 days ago. Feels similar to when she had a clot in the groin area in May of this year.  Pt reports she has been off her xarelto for about 3 months from her previous clot.

## 2017-04-27 ENCOUNTER — Emergency Department (HOSPITAL_COMMUNITY): Payer: 59

## 2017-04-27 ENCOUNTER — Emergency Department (HOSPITAL_COMMUNITY)
Admission: EM | Admit: 2017-04-27 | Discharge: 2017-04-27 | Disposition: A | Discharge status: Home / Self Care | Payer: 59 | Attending: Emergency Medicine | Admitting: Emergency Medicine

## 2017-04-27 ENCOUNTER — Encounter (HOSPITAL_COMMUNITY): Payer: Self-pay | Admitting: Emergency Medicine

## 2017-04-27 DIAGNOSIS — Z87891 Personal history of nicotine dependence: Secondary | ICD-10-CM | POA: Diagnosis not present

## 2017-04-27 DIAGNOSIS — R05 Cough: Secondary | ICD-10-CM | POA: Diagnosis not present

## 2017-04-27 DIAGNOSIS — R2241 Localized swelling, mass and lump, right lower limb: Secondary | ICD-10-CM | POA: Diagnosis not present

## 2017-04-27 DIAGNOSIS — Z853 Personal history of malignant neoplasm of breast: Secondary | ICD-10-CM | POA: Insufficient documentation

## 2017-04-27 DIAGNOSIS — I10 Essential (primary) hypertension: Secondary | ICD-10-CM | POA: Diagnosis not present

## 2017-04-27 DIAGNOSIS — Z79899 Other long term (current) drug therapy: Secondary | ICD-10-CM | POA: Diagnosis not present

## 2017-04-27 DIAGNOSIS — J40 Bronchitis, not specified as acute or chronic: Secondary | ICD-10-CM | POA: Insufficient documentation

## 2017-04-27 DIAGNOSIS — M25551 Pain in right hip: Secondary | ICD-10-CM | POA: Diagnosis not present

## 2017-04-27 DIAGNOSIS — R600 Localized edema: Secondary | ICD-10-CM | POA: Diagnosis not present

## 2017-04-27 DIAGNOSIS — M79661 Pain in right lower leg: Secondary | ICD-10-CM | POA: Diagnosis not present

## 2017-04-27 DIAGNOSIS — R1031 Right lower quadrant pain: Secondary | ICD-10-CM | POA: Diagnosis present

## 2017-04-27 DIAGNOSIS — Z7901 Long term (current) use of anticoagulants: Secondary | ICD-10-CM | POA: Diagnosis not present

## 2017-04-27 DIAGNOSIS — Z7982 Long term (current) use of aspirin: Secondary | ICD-10-CM | POA: Insufficient documentation

## 2017-04-27 HISTORY — DX: Phlebitis and thrombophlebitis of superficial vessels of right lower extremity: I80.01

## 2017-04-27 HISTORY — DX: Venous insufficiency (chronic) (peripheral): I87.2

## 2017-04-27 MED ORDER — KETOROLAC TROMETHAMINE 30 MG/ML IJ SOLN
30.0000 mg | Freq: Once | INTRAMUSCULAR | Status: AC
  Administered 2017-04-27: 30 mg via INTRAMUSCULAR
  Filled 2017-04-27: qty 1

## 2017-04-27 MED ORDER — DOXYCYCLINE HYCLATE 100 MG PO CAPS: 100.0000 mg | ORAL_CAPSULE | Freq: Two times a day (BID) | ORAL | 0 refills | Status: DC

## 2017-04-27 NOTE — ED Triage Notes (Signed)
Pt states she has pain in her lower back, groin and down right leg to posterior knee x several months but worse over last few days

## 2017-04-27 NOTE — ED Provider Notes (Signed)
[facility] EMERGENCY DEPARTMENT Provider Note   CSN: [id number] Arrival date & time: 04/27/17  0349  Time seen 04:24 AM   History   Chief Complaint Chief Complaint  Patient presents with  . Groin Pain    HPI Virginia Morgan is a 61 y.o. female.  HPI patient states she has had swelling in her right leg for the past year, she states it has been worse and it is better now than it has been.  She complains of pain in her right posterior buttock and in her right groin for the past 2 weeks, sometimes it radiates down into her right knee.  She states it is intermittent and it last a few seconds.  She also complains of pain in her right groin for the past 3-4 days.  She states bending over or raising up her right leg makes the pain worse, Aleve makes it feel better.  She denies fever, rash, worsening redness of her leg, chest pain or shortness of breath.  She has had a cough for the past 2 weeks and states her chest feels tight.  She has not had a fever.  She denies any known injury to her extremity.  She states she had a DVT in her leg in February or March that was treated initially was Xarelto.  She states her GYN was concerned about the amount of swelling in her leg and referred her to Dr. Kellie Simmering.  She saw him in July at that point he stopped her Xarelto and had her take aspirin 325 mg a day.  He was going to do laser surgery for her varicose veins however when he saw her in October the saphenous vein was occluded.  At that point he told her the blood clot was gone and she could stop her aspirin which she has done.  PCP Via, Lennette Bihari, MD Vascular Dr Kellie Simmering  Past Medical History:  Diagnosis Date  . Breast cancer ([place])    radiation  . DVT (deep venous thrombosis) ([place])   . Hypertension   . Pneumonia     Patient Active Problem List   Diagnosis Date Noted  . Varicose veins of right lower extremity with complications 50/53/9767  . Acute bronchitis 12/28/2011  . Hypoxia 12/27/2011  . Dyspnea  12/27/2011  . Tachycardia 12/27/2011    Past Surgical History:  Procedure Laterality Date  . APPENDECTOMY    . Left lumpectomy    . MANDIBLE FRACTURE SURGERY      OB History    No data available       Home Medications    Prior to Admission medications   Medication Sig Start Date End Date Taking? Authorizing Provider  atenolol (TENORMIN) 50 MG tablet Take 50 mg by mouth daily.   Yes [provider]  buPROPion (WELLBUTRIN SR) 100 MG 12 hr tablet Take 7.5 mg by mouth daily.    Yes [provider]  hydrochlorothiazide (HYDRODIURIL) 25 MG tablet Take 25 mg by mouth daily.   Yes [provider]  oxcarbazepine (TRILEPTAL) 600 MG tablet  10/06/16  Yes [provider]  ALPRAZolam (XANAX) 0.5 MG tablet Take 0.5 mg by mouth 3 (three) times daily as needed for anxiety.    [provider]  aspirin 325 MG tablet Take 325 mg by mouth daily.    [provider]  FLUoxetine (PROZAC) 20 MG tablet Take 80 mg by mouth daily.     [provider]  HYDROcodone-acetaminophen (NORCO) 5-325 MG per tablet  Take 1 tablet by mouth every 6 (six) hours as needed for severe pain. Patient not taking: Reported on 02/28/2017 02/21/14   Street, [place], PA-C  Multiple Vitamin (MULTIVITAMIN WITH MINERALS) TABS tablet Take 1 tablet by mouth daily.    [provider]  ondansetron (ZOFRAN) 8 MG tablet Take 1 tablet (8 mg total) by mouth every 8 (eight) hours as needed for nausea or vomiting. Patient not taking: Reported on 02/28/2017 02/21/14   Street, [place], PA-C  polyethylene glycol (MIRALAX / Floria Raveling) packet Take 17 g by mouth daily. Patient not taking: Reported on 02/28/2017 02/21/14   Street, [place], PA-C  ranitidine (ZANTAC) 150 MG tablet Take 1 tablet (150 mg total) by mouth 2 (two) times daily. Patient not taking: Reported on 02/28/2017 02/21/14   Street, [place], [place]  vitamin E 400 UNIT capsule Take 400 Units by mouth daily.     [provider]  Alveda Reasons 20 MG TABS tablet  10/24/16   [provider]    Family History Family History  Problem Relation Age of Onset  . Diabetes Mother   . Cancer Mother   . Hypertension Other   . Coronary artery disease Other     Social History Social History   Tobacco Use  . Smoking status: Former Research scientist (life sciences)  . Smokeless tobacco: Former Network engineer Use Topics  . Alcohol use: Yes    Comment: Occasional Wine  . Drug use: No  employed   Allergies   Penicillins; Bee venom; and Sulfa antibiotics   Review of Systems Review of Systems  All other systems reviewed and are negative.    Physical Exam Updated Vital Signs BP (!) 173/96 (BP Location: Right Arm)   Pulse 87   Temp 98.2 F (36.8 C) (Oral)   Resp (!) 22   Ht 5\' 4"  (1.626 m)   Wt (!) 172.4 kg (380 lb)   SpO2 97%   BMI 65.23 kg/m   Vital signs normal except hypertension   Physical Exam  Constitutional: She is oriented to person, place, and time. She appears well-developed and well-nourished.  Non-toxic appearance. She does not appear ill. No distress.  HENT:  Head: Normocephalic and atraumatic.  Right Ear: External ear normal.  Left Ear: External ear normal.  Nose: Nose normal. No mucosal edema or rhinorrhea.  Mouth/Throat: Oropharynx is clear and moist and mucous membranes are normal. No dental abscesses or uvula swelling.  Eyes: Conjunctivae and EOM are normal. Pupils are equal, round, and reactive to light.  Neck: Normal range of motion and full passive range of motion without pain. Neck supple.  Cardiovascular: Normal rate, regular rhythm and normal heart sounds. Exam reveals no gallop and no friction rub.  No murmur heard. Pulmonary/Chest: Effort normal. No respiratory distress. She has decreased breath sounds. She has no wheezes. She has no rhonchi. She has no rales. She exhibits no tenderness and no crepitus.  Coughing frequently  Abdominal: Soft. Normal appearance and bowel  sounds are normal. She exhibits no distension. There is no tenderness. There is no rebound and no guarding.  Musculoskeletal: Normal range of motion. She exhibits no edema or tenderness.       Back:       Legs: Moves all extremities well.  However when she flexes her right knee it causes pain in her right true hip joint.  When asked her where her leg hurts she also points to the right true hip joint.  When I examine her back she is nontender to  palpation in the thoracic or lumbar spine.  She is tender in the area in her right buttock which would be the posterior portion of her hip joint.  She has some mild redness of her distal extremities around the ankle, slightly worse on the right compared to the left, however she states that is chronic.  She does appear to have some enlargement of her right lower extremity compared to her left.  She is nontender to palpation in the calf or along the medial thigh.  She is very tender over the true hip joint.  Neurological: She is alert and oriented to person, place, and time. She has normal strength. No cranial nerve deficit.  Skin: Skin is warm, dry and intact. No rash noted. No erythema. No pallor.  Psychiatric: She has a normal mood and affect. Her speech is normal and behavior is normal. Her mood appears not anxious.  Nursing note and vitals reviewed.    ED Treatments / Results  Labs (all labs ordered are listed, but only abnormal results are displayed) Labs Reviewed - No data to display  EKG  EKG Interpretation None       Radiology Dg Chest 2 View  Result Date: 04/27/2017 CLINICAL DATA:  Cough EXAM: CHEST  2 VIEW COMPARISON:  Chest radiograph 02/21/2014 FINDINGS: The heart size and mediastinal contours are within normal limits. Both lungs are clear. The visualized skeletal structures are unremarkable. IMPRESSION: No active cardiopulmonary disease. Electronically Signed   By: Ulyses Jarred M.D.   On: 04/27/2017 05:05   Dg Hip Unilat W Or Wo  Pelvis 2-3 Views Right  Result Date: 04/27/2017 CLINICAL DATA:  Hip pain EXAM: DG HIP (WITH OR WITHOUT PELVIS) 2-3V RIGHT COMPARISON:  None. FINDINGS: There is no evidence of hip fracture or dislocation. There is no evidence of arthropathy or other focal bone abnormality. IMPRESSION: Negative. Electronically Signed   By: Ulyses Jarred M.D.   On: 04/27/2017 05:10    Procedures Procedures (including critical care time)    Medications Ordered in ED Medications  ketorolac (TORADOL) 30 MG/ML injection 30 mg (30 mg Intramuscular Given 04/27/17 9323)     Initial Impression / Assessment and Plan / ED Course  I have reviewed the triage vital signs and the nursing notes.  Pertinent labs & imaging results that were available during my care of the patient were reviewed by me and considered in my medical decision making (see chart for details).     X-rays were done of her right hip, I am suspicious that her pain may be from arthritis or some other type of pathology of her hip.  A chest x-ray was done also due to the cough she has had for a couple weeks.  5:30 a.m. patient's x-ray do not show any acute changes.  She was given Toradol for pain.  We discussed getting a Doppler ultrasound, at this point she might await in the ED and get it done this morning rather than leave and have to come back.  She is agreeable.  Patient was given Toradol IM for pain.  07:45 AM Pt turned over to Dr Thurnell Garbe at change of shift to get doppler US results.   I reviewed Dr. Evelena Leyden note on February 28, 2017.  At that time she was a 21-month follow-up for chronic swelling and pain in her right lower extremity.  She had superficial thrombophlebitis and was treated by Dr. Aleda Grana briefly with Xarelto.  This was discontinued in July and she was taking a  full dose aspirin since.  At that time she had gross reflux in the right great saphenous vein.  She was recommended to use compression stockings however he did not think  it would work due to the size of her legs.  She reported to him the swelling has been stable.  He was going to do a 66-month follow-up to consider doing laser surgery of her vein.  However during that exam he did a bedside ultrasound of her right leg.  He states the right great saphenous vein was noncompressible from the mid thigh distally with appearance of a chronic thrombus within the lumen.  He states there was a short segment of the great saphenous vein that was open proximally with reflux.  At that time he could not recommend intervention, he recommended elevating the foot of her bed a couple of inches, to try to wear the compression stockings and he stated no further suggestions.  Final Clinical Impressions(s) / ED Diagnoses   Final diagnoses:  Hip pain, right  Bronchitis     Disposition pending  Rolland Porter, MD, Barbette Or, MD 04/27/17 [phone]

## 2017-04-27 NOTE — Discharge Instructions (Addendum)
Drink plenty of fluids. Take mucinex DM OTC for cough. Take the antibiotic until gone. Take over the counter tylenol and/or ibuprofen, as directed on packaging, as needed for discomfort. Take the prescription as directed.  Apply moist heat or ice to the area(s) of discomfort, for 15 minutes at a time, several times per day for the next few days.  Do not fall asleep on a heating or ice pack.  Call your regular medical doctor today to schedule a follow up appointment in the next 2 days.  Return to the Emergency Department immediately if worsening.

## 2017-04-27 NOTE — ED Provider Notes (Signed)
Pt received at sign out with Virginia Morgan RLE pending. Virginia Morgan reassuring. Tx cough symptomatically, f/u PMD for chronic RLE pain. Dx and testing d/w pt.  Questions answered.  Verb understanding, agreeable to d/c home with outpt f/u.     Virginia Morgan Venous Img Lower Unilateral Right Result Date: 04/27/2017 CLINICAL DATA:  Right lower extremity pain and edema for the past several years. Former smoker. History of prior DVT and breast cancer. Patient currently on anticoagulation. Evaluate for acute or chronic DVT. EXAM: RIGHT LOWER EXTREMITY VENOUS DOPPLER ULTRASOUND TECHNIQUE: Gray-scale sonography with graded compression, as well as color Doppler and duplex ultrasound were performed to evaluate the lower extremity deep venous systems from the level of the common femoral vein and including the common femoral, femoral, profunda femoral, popliteal and calf veins including the posterior tibial, peroneal and gastrocnemius veins when visible. The superficial great saphenous vein was also interrogated. Spectral Doppler was utilized to evaluate flow at rest and with distal augmentation maneuvers in the common femoral, femoral and popliteal veins. COMPARISON:  None. FINDINGS: Examination is degraded due to patient body habitus and poor sonographic window. Contralateral Common Femoral Vein: Respiratory phasicity is normal and symmetric with the symptomatic side. No evidence of thrombus. Normal compressibility. Common Femoral Vein: No evidence of thrombus. Normal compressibility, respiratory phasicity and response to augmentation. Saphenofemoral Junction: No evidence of thrombus. Normal compressibility and flow on color Doppler imaging. Profunda Femoral Vein: No evidence of thrombus. Normal compressibility and flow on color Doppler imaging. Femoral Vein: No evidence of thrombus. Normal compressibility, respiratory phasicity and response to augmentation. Popliteal Vein: No evidence of thrombus. Normal compressibility, respiratory phasicity and  response to augmentation. Calf Veins: No evidence of thrombus. Normal compressibility and flow on color Doppler imaging. Superficial Great Saphenous Vein: No evidence of thrombus. Normal compressibility. Venous Reflux:  None. Other Findings:  None. IMPRESSION: No evidence of deep venous thrombosis. Electronically Signed   By: Sandi Mariscal M.D.   On: 04/27/2017 09:24       Francine Graven, DO 04/27/17 1016

## 2017-05-30 ENCOUNTER — Encounter (INDEPENDENT_AMBULATORY_CARE_PROVIDER_SITE_OTHER): Payer: Self-pay | Admitting: Vascular Surgery

## 2017-07-07 DIAGNOSIS — I10 Essential (primary) hypertension: Secondary | ICD-10-CM | POA: Diagnosis not present

## 2017-09-27 DIAGNOSIS — H2513 Age-related nuclear cataract, bilateral: Secondary | ICD-10-CM | POA: Diagnosis not present

## 2017-09-27 DIAGNOSIS — H1851 Endothelial corneal dystrophy: Secondary | ICD-10-CM | POA: Diagnosis not present

## 2017-09-27 DIAGNOSIS — H52213 Irregular astigmatism, bilateral: Secondary | ICD-10-CM | POA: Diagnosis not present

## 2017-10-19 DIAGNOSIS — Z6841 Body Mass Index (BMI) 40.0 and over, adult: Secondary | ICD-10-CM | POA: Diagnosis not present

## 2017-10-19 DIAGNOSIS — Z01419 Encounter for gynecological examination (general) (routine) without abnormal findings: Secondary | ICD-10-CM | POA: Diagnosis not present

## 2017-11-30 DIAGNOSIS — Z719 Counseling, unspecified: Secondary | ICD-10-CM | POA: Diagnosis not present

## 2017-12-22 DIAGNOSIS — I10 Essential (primary) hypertension: Secondary | ICD-10-CM | POA: Diagnosis not present

## 2018-04-17 DIAGNOSIS — R222 Localized swelling, mass and lump, trunk: Secondary | ICD-10-CM | POA: Diagnosis not present

## 2018-04-17 DIAGNOSIS — I10 Essential (primary) hypertension: Secondary | ICD-10-CM | POA: Diagnosis not present

## 2018-07-01 ENCOUNTER — Emergency Department (HOSPITAL_COMMUNITY): Payer: 59

## 2018-07-01 ENCOUNTER — Encounter (HOSPITAL_COMMUNITY): Payer: Self-pay

## 2018-07-01 ENCOUNTER — Emergency Department (HOSPITAL_COMMUNITY)
Admission: EM | Admit: 2018-07-01 | Discharge: 2018-07-01 | Disposition: A | Discharge status: Home / Self Care | Payer: 59 | Attending: Emergency Medicine | Admitting: Emergency Medicine

## 2018-07-01 ENCOUNTER — Other Ambulatory Visit: Payer: Self-pay

## 2018-07-01 DIAGNOSIS — M79672 Pain in left foot: Secondary | ICD-10-CM | POA: Diagnosis present

## 2018-07-01 DIAGNOSIS — Z87891 Personal history of nicotine dependence: Secondary | ICD-10-CM | POA: Diagnosis not present

## 2018-07-01 DIAGNOSIS — Z79899 Other long term (current) drug therapy: Secondary | ICD-10-CM | POA: Insufficient documentation

## 2018-07-01 DIAGNOSIS — Y92008 Other place in unspecified non-institutional (private) residence as the place of occurrence of the external cause: Secondary | ICD-10-CM | POA: Insufficient documentation

## 2018-07-01 DIAGNOSIS — Y9389 Activity, other specified: Secondary | ICD-10-CM | POA: Insufficient documentation

## 2018-07-01 DIAGNOSIS — X500XXA Overexertion from strenuous movement or load, initial encounter: Secondary | ICD-10-CM | POA: Diagnosis not present

## 2018-07-01 DIAGNOSIS — S93602A Unspecified sprain of left foot, initial encounter: Secondary | ICD-10-CM

## 2018-07-01 DIAGNOSIS — I10 Essential (primary) hypertension: Secondary | ICD-10-CM | POA: Insufficient documentation

## 2018-07-01 DIAGNOSIS — Y999 Unspecified external cause status: Secondary | ICD-10-CM | POA: Insufficient documentation

## 2018-07-01 NOTE — ED Notes (Signed)
ED Provider at bedside. 

## 2018-07-01 NOTE — ED Triage Notes (Signed)
Pt reports that she rolled her L foot when getting in to the car. She caught herself and did not fall. Pain on top of L foot when standing. Denies pain in ankle. A&Ox4.

## 2018-07-01 NOTE — ED Provider Notes (Signed)
[facility] DEPT Provider Note   CSN: [id number] Arrival date & time: 07/01/18  1921    History   Chief Complaint Chief Complaint  Patient presents with  . Foot Pain    HPI    Virginia Morgan is a 63 y.o. female with a PMHx of DVT/superficial thrombophlebitis, HTN, venous insufficiency of R leg, and other conditions listed below, who presents to the ED with complaints of left foot pain after she accidentally rolled in her driveway around 1:60 PM.  She describes her pain as 7/10 intermittent sharp nonradiating left foot pain that worsens with weightbearing, with no treatments tried prior to arrival.  She states it does not hurt if she does not bear weight.  She does not have an orthopedist.  She denies any swelling, bruising, numbness, tingling, focal weakness, or any other complaints at this time.  The history is provided by the patient and medical records. No language interpreter was used.  Foot Pain     Past Medical History:  Diagnosis Date  . Breast cancer ([place])    radiation  . DVT (deep venous thrombosis) ([place])   . Hypertension   . Pneumonia   . Superficial thrombophlebitis of right leg   . Venous insufficiency of right leg     Patient Active Problem List   Diagnosis Date Noted  . Varicose veins of right lower extremity with complications 10/93/2355  . Acute bronchitis 12/28/2011  . Hypoxia 12/27/2011  . Dyspnea 12/27/2011  . Tachycardia 12/27/2011    Past Surgical History:  Procedure Laterality Date  . APPENDECTOMY    . Left lumpectomy    . MANDIBLE FRACTURE SURGERY       OB History   No obstetric history on file.      Home Medications    Prior to Admission medications   Medication Sig Start Date End Date Taking? Authorizing Provider  ALPRAZolam Duanne Moron) 0.5 MG tablet Take 0.5 mg by mouth 3 (three) times daily as needed for anxiety.    [provider]  atenolol (TENORMIN) 50 MG tablet Take 50 mg by mouth daily.     [provider]  buPROPion (WELLBUTRIN XL) 150 MG 24 hr tablet Take 1 tablet by mouth 3 (three) times daily. 04/04/17   [provider]  busPIRone (BUSPAR) 7.5 MG tablet Take 1 tablet by mouth 3 (three) times daily. 03/10/17   [provider]  doxycycline (VIBRAMYCIN) 100 MG capsule Take 1 capsule (100 mg total) by mouth 2 (two) times daily. 04/27/17   Rolland Porter, MD  hydrochlorothiazide (HYDRODIURIL) 25 MG tablet Take 25 mg by mouth daily.    [provider]  oxcarbazepine (TRILEPTAL) 600 MG tablet Take 600 mg by mouth 2 (two) times daily.  10/06/16   [provider]    Family History Family History  Problem Relation Age of Onset  . Diabetes Mother   . Cancer Mother   . Hypertension Other   . Coronary artery disease Other     Social History Social History   Tobacco Use  . Smoking status: Former Research scientist (life sciences)  . Smokeless tobacco: Former Network engineer Use Topics  . Alcohol use: Yes    Comment: Occasional Wine  . Drug use: No     Allergies   Penicillins; Bee venom; and Sulfa antibiotics   Review of Systems Review of Systems  Musculoskeletal: Positive for arthralgias. Negative for joint swelling.  Skin: Negative for color change.  Allergic/Immunologic: Negative for immunocompromised state.  Neurological: Negative for weakness and numbness.     Physical Exam Updated Vital Signs BP 128/73 (BP Location: Right Arm)   Pulse 84   Temp 97.9 F (36.6 C) (Oral)   Resp 18   Ht 5\' 4"  (1.626 m)   Wt (!) 172.4 kg   SpO2 97%   BMI 65.23 kg/m   Physical Exam Vitals signs and nursing note reviewed.  Constitutional:      General: She is not in acute distress.    Appearance: Normal appearance. She is well-developed. She is not toxic-appearing.     Comments: Afebrile, nontoxic, NAD  HENT:     Head: Normocephalic and atraumatic.  Eyes:     General:        Right eye: No discharge.        Left eye: No discharge.      Conjunctiva/sclera: Conjunctivae normal.  Neck:     Musculoskeletal: Normal range of motion and neck supple.  Cardiovascular:     Rate and Rhythm: Normal rate.     Pulses: Normal pulses.  Pulmonary:     Effort: Pulmonary effort is normal. No respiratory distress.  Abdominal:     General: There is no distension.  Musculoskeletal: Normal range of motion.     Left foot: Normal range of motion. Tenderness present. No swelling, crepitus or deformity.       Feet:     Comments: L foot with FROM intact in all digits, with mild diffuse TTP across the MTP joints, no bruising or swelling, no crepitus or deformity, no other areas of tenderness to remainder of foot and ankle, Strength and sensation grossly intact, distal pulses intact, compartments soft.   Skin:    General: Skin is warm and dry.     Findings: No rash.  Neurological:     Mental Status: She is alert and oriented to person, place, and time.     Sensory: Sensation is intact. No sensory deficit.     Motor: Motor function is intact.  Psychiatric:        Mood and Affect: Mood and affect normal.        Behavior: Behavior normal.      ED Treatments / Results  Labs (all labs ordered are listed, but only abnormal results are displayed) Labs Reviewed - No data to display  EKG None  Radiology Dg Foot Complete Left  Result Date: 07/01/2018 CLINICAL DATA:  Left foot pain.  Rolled foot. EXAM: LEFT FOOT - COMPLETE 3+ VIEW COMPARISON:  None. FINDINGS: Lucency noted at the base of the left 2nd metatarsal compatible with benign cyst. No acute bony abnormality. No fracture, subluxation or dislocation. Soft tissues are intact. IMPRESSION: No acute bony abnormality. Electronically Signed   By: Rolm Baptise M.D.   On: 07/01/2018 20:35    Procedures Procedures (including critical care time)  Medications Ordered in ED Medications - No data to display   Initial Impression / Assessment and Plan / ED Course  I have reviewed the triage vital  signs and the nursing notes.  Pertinent labs & imaging results that were available during my care of the patient were reviewed by me and considered in my medical decision making (see chart for details).        63 y.o. female here with left foot pain after rolling her foot.  On exam, mild diffuse tenderness to the MTP joints, but none to the mid or hindfoot or the ankle, F ROM intact in all toes, neurovascularly intact,  no bruising or swelling.  X-ray pending.  Will reassess after x-ray.  8:56 PM Xray negative for fx. Likely just sprain. Advised RICE, will give crutches for comfort, pt declines wanting postop shoe. F/up with ortho in 1wk for recheck. I explained the diagnosis and have given explicit precautions to return to the ER including for any other new or worsening symptoms. The patient understands and accepts the medical plan as it's been dictated and I have answered their questions. Discharge instructions concerning home care and prescriptions have been given. The patient is STABLE and is discharged to home in good condition.    Final Clinical Impressions(s) / ED Diagnoses   Final diagnoses:  Sprain of left foot, initial encounter    ED Discharge Orders    [address], [place], [place] 07/01/18 2056    Daleen Bo, MD 07/01/18 2356

## 2018-07-01 NOTE — Discharge Instructions (Signed)
Use crutches as needed for comfort. Ice and elevate foot throughout the day, using ice pack for no more than 20 minutes every hour.  Alternate between tylenol and motrin as needed for pain relief. Follow up with the orthopedist in 1 week for recheck of symptoms. Return to the ER for changes or worsening symptoms.

## 2018-07-01 NOTE — ED Notes (Signed)
Patient transported to X-ray 

## 2018-11-08 ENCOUNTER — Institutional Professional Consult (permissible substitution): Payer: Self-pay | Admitting: Medical

## 2018-11-19 NOTE — Progress Notes (Signed)
CARDIOLOGY CONSULT NOTE       Patient ID: Virginia Morgan MRN: [id number] DOB/AGE: 1955/11/08 63 y.o.  Admit date: (Not on file) Referring Physician: See below Primary Physician: 62 Kid, MD Primary Cardiologist: None Reason for Consultation: Dyspnea/ Edema  Active Problems:   * No active hospital problems. *   HPI:  63 y.o. obese female referred by primary above for a few weeks of increasing dyspnea and edema. Previously seen by Virginia Morgan and Virginia Morgan for DVT and known venous reflux disease in right common femoral and greater saphenous veins. Has been on diuretic. No history of cardiac disease , CHF or valvular disease Besides DVT has also had superficial phlebitis in RLE  On atenolol in addition to her hydrodiuril for her BP She indicates her insurance won't cover bariatric surgery was referred to [facility] weight clinic Labs normal including K/Cr and factor 5 Ledien Protein S although I am not sure why they were checked. CXR showed no CE given technique and patient obesity    She is not sure of her meds We had her on HCTZ she indicates starting ? lisnopril with diuretic recently He edema does improve with elevation of her legs   ROS All other systems reviewed and negative except as noted above  Past Medical History:  Diagnosis Date  . Breast cancer ([place])    radiation  . DVT (deep venous thrombosis) ([place])   . Hypertension   . Pneumonia   . Superficial thrombophlebitis of right leg   . Venous insufficiency of right leg     Family History  Problem Relation Age of Onset  . Diabetes Mother   . Cancer Mother   . Hypertension Other   . Coronary artery disease Other     Social History   Socioeconomic History  . Marital status: Divorced    Spouse name: Not on file  . Number of children: Not on file  . Years of education: Not on file  . Highest education level: Not on file  Occupational History  . Not on file  Social Needs  . Financial resource strain: Not on file  .  Food insecurity    Worry: Not on file    Inability: Not on file  . Transportation needs    Medical: Not on file    Non-medical: Not on file  Tobacco Use  . Smoking status: Former Research scientist (life sciences)  . Smokeless tobacco: Former Network engineer and Sexual Activity  . Alcohol use: Yes    Comment: Occasional Wine  . Drug use: No  . Sexual activity: Never  Lifestyle  . Physical activity    Days per week: Not on file    Minutes per session: Not on file  . Stress: Not on file  Relationships  . Social Herbalist on phone: Not on file    Gets together: Not on file    Attends religious service: Not on file    Active member of club or organization: Not on file    Attends meetings of clubs or organizations: Not on file    Relationship status: Not on file  . Intimate partner violence    Fear of current or ex partner: Not on file    Emotionally abused: Not on file    Physically abused: Not on file    Forced sexual activity: Not on file  Other Topics Concern  . Not on file  Social History Narrative  . Not on file    Past  Surgical History:  Procedure Laterality Date  . APPENDECTOMY    . Left lumpectomy    . MANDIBLE FRACTURE SURGERY          Physical Exam: Blood pressure 140/75, pulse 62, temperature 97.7 F (36.5 C), height 5\' 4"  (1.626 m), weight (!) 366 lb (166 kg).   Affect appropriate Obese female  HEENT: normal Neck supple with no adenopathy JVP normal no bruits no thyromegaly Lungs clear with no wheezing and good diaphragmatic motion Heart:  S1/S2 no murmur, no rub, gallop or click PMI normal Abdomen: benighn, BS positve, no tenderness, no AAA no bruit.  No HSM or HJR Distal pulses intact with no bruits Plus 2 LE edema worse on right with chronic stasis changes  Neuro non-focal Skin warm and dry No muscular weakness   Labs:   Lab Results  Component Value Date   WBC 9.3 02/21/2014   HGB 13.2 02/21/2014   HCT 39.9 02/21/2014   MCV 93.7 02/21/2014   PLT  284 02/21/2014   No results for input(s): NA, K, CL, CO2, BUN, CREATININE, CALCIUM, PROT, BILITOT, ALKPHOS, ALT, AST, GLUCOSE in the last 168 hours.  Invalid input(s): LABALBU Lab Results  Component Value Date   MEQASTM 196 (H) 12/28/2011   CKMB 7.5 (HH) 12/28/2011   TROPONINI <0.30 12/28/2011   No results found for: CHOL No results found for: HDL No results found for: LDLCALC No results found for: TRIG No results found for: CHOLHDL No results found for: LDLDIRECT    Radiology: No results found.  EKG: 2014 NSR normal ECG    ASSESSMENT AND PLAN:   Edema:  This is related to her obesity, previous DVT and phlebitis with known venous reflux disease She needs to f/u with [facility] one of [place] Virginia Morgan partners and Virginia Morgan form vein clinic will clarify what her meds are and what diuretic she is on may benefit from PRN lasix in addition to thiazide diuretic  Dyspnea:  Related to obesity will check echo for RV/LV function    HTN:  Well controlled.  Continue current medications and low sodium Dash type diet.    F/U PRN if echo normal   Signed: Jenkins Morgan 62/17/2020, 2:18 PM

## 2018-11-23 ENCOUNTER — Other Ambulatory Visit: Payer: Self-pay

## 2018-11-23 ENCOUNTER — Ambulatory Visit: Payer: 59 | Admitting: Cardiovascular Disease

## 2018-11-23 ENCOUNTER — Encounter: Payer: Self-pay | Admitting: Cardiovascular Disease

## 2018-11-23 VITALS — BP 140/75 | HR 62 | Temp 97.7°F | Ht 64.0 in | Wt 366.0 lb

## 2018-11-23 DIAGNOSIS — R609 Edema, unspecified: Secondary | ICD-10-CM | POA: Diagnosis not present

## 2018-11-23 DIAGNOSIS — R0600 Dyspnea, unspecified: Secondary | ICD-10-CM

## 2018-11-23 DIAGNOSIS — I517 Cardiomegaly: Secondary | ICD-10-CM

## 2018-11-23 MED ORDER — FUROSEMIDE 20 MG PO TABS: ORAL_TABLET | ORAL | 3 refills | Status: DC

## 2018-11-23 NOTE — Patient Instructions (Signed)
Medication Instructions:  PLEASE CALL WITH MEDICATIONS   Labwork: NONE  Testing/Procedures: Your physician has requested that you have an echocardiogram. Echocardiography is a painless test that uses sound waves to create images of your heart. It provides your doctor with information about the size and shape of your heart and how well your heart's chambers and valves are working. This procedure takes approximately one hour. There are no restrictions for this procedure.    Follow-Up: Your physician recommends that you schedule a follow-up appointment in: AS NEEDED    Any Other Special Instructions Will Be Listed Below (If Applicable).     If you need a refill on your cardiac medications before your next appointment, please call your pharmacy.

## 2018-11-23 NOTE — Addendum Note (Signed)
Addended by: Debbora Lacrosse R on: 11/23/2018 02:55 PM   Modules accepted: Orders

## 2018-12-07 ENCOUNTER — Ambulatory Visit (HOSPITAL_COMMUNITY): Payer: 59

## 2018-12-14 ENCOUNTER — Other Ambulatory Visit: Payer: Self-pay

## 2018-12-14 ENCOUNTER — Ambulatory Visit (HOSPITAL_COMMUNITY)
Admission: RE | Admit: 2018-12-14 | Discharge: 2018-12-14 | Disposition: A | Discharge status: Home / Self Care | Payer: 59 | Source: Ambulatory Visit | Attending: Cardiovascular Disease | Admitting: Cardiovascular Disease

## 2018-12-14 DIAGNOSIS — R609 Edema, unspecified: Secondary | ICD-10-CM | POA: Diagnosis present

## 2018-12-14 DIAGNOSIS — R0600 Dyspnea, unspecified: Secondary | ICD-10-CM | POA: Diagnosis present

## 2018-12-14 NOTE — Progress Notes (Signed)
*  PRELIMINARY RESULTS* Echocardiogram 2D Echocardiogram has been performed.  Virginia Morgan 12/14/2018, 2:05 PM

## 2018-12-17 ENCOUNTER — Telehealth: Payer: Self-pay | Admitting: Cardiovascular Disease

## 2018-12-17 NOTE — Telephone Encounter (Signed)
Pt aware of echo results ./cy 

## 2018-12-17 NOTE — Telephone Encounter (Signed)
Patient returned call for Echo results.  ?

## 2018-12-21 ENCOUNTER — Encounter: Payer: Self-pay | Admitting: Family Medicine

## 2018-12-21 ENCOUNTER — Ambulatory Visit (INDEPENDENT_AMBULATORY_CARE_PROVIDER_SITE_OTHER): Payer: 59 | Admitting: Family Medicine

## 2018-12-21 DIAGNOSIS — M1611 Unilateral primary osteoarthritis, right hip: Secondary | ICD-10-CM | POA: Insufficient documentation

## 2018-12-21 DIAGNOSIS — M25551 Pain in right hip: Secondary | ICD-10-CM

## 2018-12-21 MED ORDER — NABUMETONE 750 MG PO TABS: 750.0000 mg | ORAL_TABLET | Freq: Two times a day (BID) | ORAL | 6 refills | Status: DC | PRN

## 2018-12-21 MED ORDER — GLUCOSAMINE SULFATE 1000 MG PO CAPS: 1.0000 | ORAL_CAPSULE | Freq: Two times a day (BID) | ORAL | 1 refills | Status: DC

## 2018-12-21 NOTE — Progress Notes (Signed)
Office Visit Note   Patient: Virginia Morgan           Date of Birth: 1955-10-06           MRN: [id number] Visit Date: 12/21/2018 Requested by: Dineen Kid, MD [address] [place],  [place] [address] PCP: Dineen Kid, MD  Subjective: Chief Complaint  Patient presents with  . Right Hip - Pain    H/o blood clot in right groin 2 years. Pain in posterior hip to knee at times. "Catching" sensation lateral hip with walking and getting up/down.    HPI: She is here with right groin pain.  Symptoms for 2 years, no injury.  At one point her pain became severe and she went to the ER she was told that x-rays were normal.  She has been using Aleve since then, sometimes 5 pills in the morning and 2 or 3 in the evening.  Pain is constant but is worse when getting in and out of a car.              ROS: Denies fevers or chills.  All other systems were reviewed and are negative.  Objective: Vital Signs: There were no vitals taken for this visit.  Physical Exam:  General:  Alert and oriented, in no acute distress. Pulm:  Breathing unlabored. Psy:  Normal mood, congruent affect. Skin: No visible rash. Right hip: She is morbidly obese, difficult to palpate her greater trochanter.  She is slightly tender to palpation in the anterior hip.  She has moderate pain with passive flexion and internal rotation, but overall she has pretty good range of motion.  Imaging: 2018 x-rays were reviewed on computer and to my interpretation she has moderate degenerative change.  Assessment & Plan: 1.  Chronic right hip pain probably due to osteoarthritis.  Exacerbated by morbid obesity. -We discussed the importance of weight loss for long-term joint health.  She would be a good candidate for bariatric surgery. -We will try Relafen for inflammation, glucosamine sulfate over-the-counter.  She will try to minimize sugar intake. -If symptoms worsen, could repeat x-rays and possibly refer her to Dr. Ernestina Patches for  fluoroscopically guided injection.     Procedures: No procedures performed  No notes on file     PMFS History: Patient Active Problem List   Diagnosis Date Noted  . Unilateral primary osteoarthritis, right hip 12/21/2018  . Varicose veins of right lower extremity with complications 23/55/7322  . Acute bronchitis 12/28/2011  . Hypoxia 12/27/2011  . Dyspnea 12/27/2011  . Tachycardia 12/27/2011   Past Medical History:  Diagnosis Date  . Breast cancer ([place])    radiation  . DVT (deep venous thrombosis) ([place])   . Hypertension   . Pneumonia   . Superficial thrombophlebitis of right leg   . Venous insufficiency of right leg     Family History  Problem Relation Age of Onset  . Diabetes Mother   . Cancer Mother   . Hypertension Other   . Coronary artery disease Other     Past Surgical History:  Procedure Laterality Date  . APPENDECTOMY    . Left lumpectomy    . MANDIBLE FRACTURE SURGERY     Social History   Occupational History  . Not on file  Tobacco Use  . Smoking status: Former Research scientist (life sciences)  . Smokeless tobacco: Former Network engineer and Sexual Activity  . Alcohol use: Yes    Comment: Occasional Wine  . Drug use: No  . Sexual  activity: Never

## 2018-12-24 ENCOUNTER — Telehealth: Payer: Self-pay | Admitting: Family Medicine

## 2018-12-24 MED ORDER — TRAMADOL HCL 50 MG PO TABS: 50.0000 mg | ORAL_TABLET | Freq: Four times a day (QID) | ORAL | 0 refills | Status: DC | PRN

## 2018-12-24 NOTE — Telephone Encounter (Signed)
I called and advised the patient of the Tramadol Rx that was sent in to her pharmacy and the handicap placard application (for 6 months) is ready.

## 2018-12-24 NOTE — Telephone Encounter (Signed)
Rx sent.  Ok to give temp handicap

## 2018-12-24 NOTE — Telephone Encounter (Signed)
I called the patient to see how the Relafen is working for her. She started it on Friday evening and has been taking it bid since. She cannot feel much of a difference with it yet. Getting comfortable enough to fall asleep is her main issue. She did try the Tramadol that was given by her PCP (#5 only) - does help. She did have to also take an Aleve PM one night. She is requesting pain medication and a temporary handicap placard application.

## 2018-12-24 NOTE — Telephone Encounter (Signed)
Patient request a temporary handicap sticker and would like something for pain.

## 2019-01-03 ENCOUNTER — Telehealth: Payer: Self-pay | Admitting: Family Medicine

## 2019-01-03 NOTE — Telephone Encounter (Signed)
Patient called advised the Tramadol is not working and asked if there is another type of medicine she can take? Patient said it is hard for her to get up and down.  The number to contact patient is [phone]

## 2019-01-04 ENCOUNTER — Ambulatory Visit: Payer: Self-pay

## 2019-01-04 ENCOUNTER — Encounter: Payer: Self-pay | Admitting: Family Medicine

## 2019-01-04 ENCOUNTER — Ambulatory Visit (INDEPENDENT_AMBULATORY_CARE_PROVIDER_SITE_OTHER): Payer: 59 | Admitting: Family Medicine

## 2019-01-04 DIAGNOSIS — M25561 Pain in right knee: Secondary | ICD-10-CM | POA: Diagnosis not present

## 2019-01-04 DIAGNOSIS — M25511 Pain in right shoulder: Secondary | ICD-10-CM

## 2019-01-04 DIAGNOSIS — G8929 Other chronic pain: Secondary | ICD-10-CM | POA: Diagnosis not present

## 2019-01-04 DIAGNOSIS — M25551 Pain in right hip: Secondary | ICD-10-CM

## 2019-01-04 DIAGNOSIS — M25562 Pain in left knee: Secondary | ICD-10-CM

## 2019-01-04 MED ORDER — DICLOFENAC SODIUM 75 MG PO TBEC: 75.0000 mg | DELAYED_RELEASE_TABLET | Freq: Two times a day (BID) | ORAL | 6 refills | Status: DC | PRN

## 2019-01-04 MED ORDER — BACLOFEN 10 MG PO TABS: 5.0000 mg | ORAL_TABLET | Freq: Three times a day (TID) | ORAL | 1 refills | Status: DC | PRN

## 2019-01-04 MED ORDER — TRAMADOL HCL 50 MG PO TABS: 50.0000 mg | ORAL_TABLET | Freq: Four times a day (QID) | ORAL | 0 refills | Status: DC | PRN

## 2019-01-04 NOTE — Telephone Encounter (Signed)
I called and left this information on the patient's mobile voice mail. I also advised her that per Dr. Junius Roads' last office visit note, she needs to come back in for a recheck and new xrays if she is not improving.

## 2019-01-04 NOTE — Progress Notes (Signed)
Office Visit Note   Patient: Virginia Morgan           Date of Birth: [birth year]           MRN: [id number] Visit Date: 01/04/2019 Requested by: Dineen Kid, MD [address] [place],  [place] [address] PCP: Dineen Kid, MD  Subjective: Chief Complaint  Patient presents with  . Right Hip - Pain  . Right Shoulder - Pain  . Right Knee - Pain  . Left Knee - Pain    HPI: She is here with persistent right hip pain.  She also states that her right shoulder and both knees have been hurting her chronically.  She did mention it last time but they have been hurting not as badly as her hip.  She presumed it was all related to arthritis.  The medications helped at first, but now they do not seem to be helping and she is not sleeping well at night.  Right shoulder hurts on the lateral aspect when reaching overhead.  The hip hurts in the groin area.  The knees hurt around the kneecaps.               ROS:   All other systems were reviewed and are negative.  Objective: Vital Signs: There were no vitals taken for this visit.  Physical Exam:  General:  Alert and oriented, in no acute distress. Pulm:  Breathing unlabored. Psy:  Normal mood, congruent affect.  Right shoulder: Still has full overhead reach and behind the back reach but pain at the extreme.  Isometric rotator cuff strength is 5/5 throughout.  She does have some pain with empty can test and some tenderness in the lateral subacromial space. Right hip: Remains painful with passive flexion and internal rotation. Knees: Unable to adequately determine whether she has any effusion.  1+ patellofemoral crepitus in both knees.  They are both tender around the patellofemoral joint.  No significant joint line tenderness.   Imaging: X-rays right shoulder: Moderate AC joint DJD.  Mild glenohumeral changes.  X-rays right hip: Bone-on-bone DJD on the right, moderate to severe on the left.  X-rays of both knees: Mild patellofemoral spurring in  both knees but overall joint spaces are well-preserved.    Assessment & Plan: 1.  Right hip pain due to end-stage DJD -We discussed the importance of weight loss.  She needs to be down to 230 pounds in order to get her BMI below 40.  She is working on this. -Trial of diclofenac as needed.  Refilled tramadol.  2.  Right shoulder impingement -Trial of diclofenac.  3.  Bilateral knee pain due to patellofemoral DJD -Diclofenac, glucosamine.  Follow-up as needed.     Procedures: No procedures performed  No notes on file     PMFS History: Patient Active Problem List   Diagnosis Date Noted  . Unilateral primary osteoarthritis, right hip 12/21/2018  . Varicose veins of right lower extremity with complications 99991111  . Acute bronchitis 12/28/2011  . Hypoxia 12/27/2011  . Dyspnea 12/27/2011  . Tachycardia 12/27/2011   Past Medical History:  Diagnosis Date  . Breast cancer ([place])    radiation  . DVT (deep venous thrombosis) ([place])   . Hypertension   . Pneumonia   . Superficial thrombophlebitis of right leg   . Venous insufficiency of right leg     Family History  Problem Relation Age of Onset  . Diabetes Mother   . Cancer Mother   . Hypertension  Other   . Coronary artery disease Other     Past Surgical History:  Procedure Laterality Date  . APPENDECTOMY    . Left lumpectomy    . MANDIBLE FRACTURE SURGERY     Social History   Occupational History  . Not on file  Tobacco Use  . Smoking status: Former Research scientist (life sciences)  . Smokeless tobacco: Former Network engineer and Sexual Activity  . Alcohol use: Yes    Comment: Occasional Wine  . Drug use: No  . Sexual activity: Never

## 2019-01-04 NOTE — Telephone Encounter (Signed)
Please advise 

## 2019-01-04 NOTE — Telephone Encounter (Signed)
Baclofen Rx sent.

## 2019-01-15 ENCOUNTER — Telehealth: Payer: Self-pay | Admitting: Family Medicine

## 2019-01-15 MED ORDER — TRAMADOL HCL 50 MG PO TABS: 50.0000 mg | ORAL_TABLET | Freq: Two times a day (BID) | ORAL | 0 refills | Status: DC | PRN

## 2019-01-15 MED ORDER — DICLOFENAC SODIUM 75 MG PO TBEC: 75.0000 mg | DELAYED_RELEASE_TABLET | Freq: Two times a day (BID) | ORAL | 6 refills | Status: DC | PRN

## 2019-01-15 NOTE — Telephone Encounter (Signed)
I called and advised the patient. 

## 2019-01-15 NOTE — Telephone Encounter (Signed)
I called to get clarification: the patient is requesting refills on the tramadol and baclofen. She is taking 1 tid of each, and this is helping her be able to walk around and do things around the house.  She says she is not out of these yet - will run out on Friday - just wants to be able to pick them up by Thursday.

## 2019-01-15 NOTE — Telephone Encounter (Signed)
Rxs sent

## 2019-01-15 NOTE — Telephone Encounter (Signed)
Patient called requesting an RX refill on her Tramadol and the Diclofenac Gel.  Patient uses [facility] on [address].  CB#[phone].  Thank you.

## 2019-01-22 ENCOUNTER — Encounter: Payer: Self-pay | Admitting: Family Medicine

## 2019-01-22 DIAGNOSIS — M1611 Unilateral primary osteoarthritis, right hip: Secondary | ICD-10-CM

## 2019-01-28 ENCOUNTER — Other Ambulatory Visit: Payer: Self-pay | Admitting: Family Medicine

## 2019-01-29 ENCOUNTER — Telehealth: Payer: Self-pay | Admitting: Family Medicine

## 2019-01-29 NOTE — Telephone Encounter (Signed)
I called and advised the patient. 

## 2019-01-29 NOTE — Telephone Encounter (Signed)
Sent!

## 2019-01-29 NOTE — Telephone Encounter (Signed)
Please advise 

## 2019-01-29 NOTE — Telephone Encounter (Signed)
Patient called requesting an RX refill on her Tramadol.  Patient uses [facility] on [address].  CB#[phone].  Thank you.

## 2019-02-05 ENCOUNTER — Telehealth: Payer: Self-pay

## 2019-02-05 ENCOUNTER — Telehealth: Payer: Self-pay | Admitting: Family Medicine

## 2019-02-05 DIAGNOSIS — M25562 Pain in left knee: Secondary | ICD-10-CM

## 2019-02-05 DIAGNOSIS — G8929 Other chronic pain: Secondary | ICD-10-CM

## 2019-02-05 DIAGNOSIS — M1611 Unilateral primary osteoarthritis, right hip: Secondary | ICD-10-CM

## 2019-02-05 MED ORDER — TRAMADOL HCL 50 MG PO TABS: 50.0000 mg | ORAL_TABLET | Freq: Four times a day (QID) | ORAL | 0 refills | Status: DC | PRN

## 2019-02-05 MED ORDER — TRAMADOL HCL 50 MG PO TABS: 50.0000 mg | ORAL_TABLET | Freq: Every evening | ORAL | 0 refills | Status: DC | PRN

## 2019-02-05 NOTE — Telephone Encounter (Signed)
I called in 15 tablets of tramadol with new instructions so that it can be filled now, but I still only want her taking it once daily.  (she should ignore what the bottle says)  She should take diclofenac as needed.  Stop nabumetone.

## 2019-02-05 NOTE — Telephone Encounter (Signed)
Can't prescribe anything stronger.  I can refer her to pain clinic for that if she'd like.

## 2019-02-05 NOTE — Telephone Encounter (Signed)
I called and spoke with the patient: her last Rx for Tramadol said take bid prn on the bottle, but she continued to take tid because she did not know the directions had been changed. The pharmacy will not fill today's Rx but it is too soon, according to the directions on her last Rx. Also, she has been taking both nabumetone and diclofenac bid. There was a mistake on the 01/15/19 original message that stated the patient asked for diclofenac gel and tramadol to be refilled. She actually wanted the baclofen refilled (was not on diclofenac pills nor gel). This was clarified in my message, but she was prescribed diclofenac 75 mg bid, instead of getting the refill on baclofen. I advised the patient to not take both the nabumetone and diclofenac - it must be one or the other. She does not want to take either until she gets advice from Dr. Junius Roads on this. I told her she can still take the baclofen and she still has a few tramadol left to take. Her concern, is that [place] and will be there all weekend. She does not want to be miserable while there. Please advise.

## 2019-02-05 NOTE — Telephone Encounter (Signed)
Sent!

## 2019-02-05 NOTE — Telephone Encounter (Signed)
Patient requests to talk to Dr. Junius Roads if he has a moment She states the message didn't get sent correctly, she didn't necessarily want a refill of tramadol, because it's not helping her. Maybe be something different?

## 2019-02-05 NOTE — Telephone Encounter (Signed)
Patient stated that she was talking to you and the phone call got disconnected.  CB#[phone].  Thank you.

## 2019-02-05 NOTE — Telephone Encounter (Signed)
Patient called. Says she is going out of town and will need to refill her Tramadol before leaving. Her call back number is [phone]

## 2019-02-05 NOTE — Telephone Encounter (Signed)
I called and advised the patient. 

## 2019-02-05 NOTE — Telephone Encounter (Signed)
Please advise 

## 2019-02-06 NOTE — Telephone Encounter (Signed)
I called and advised the patient of the medication instructions. She voiced understanding.  She had 2 more things she wanted to bring up: 1) Can she be referred to pain management? & 2) While she is waiting to get weight loss surgery, is it possible for her to get an Rx for Saxenda injection for weight loss? She said her PCP had mentioned trying her on this before, as she cannot take other weight loss medications due to hypertension.

## 2019-02-06 NOTE — Addendum Note (Signed)
Addended by: Hortencia Pilar on: 02/06/2019 10:57 AM   Modules accepted: Orders

## 2019-02-06 NOTE — Telephone Encounter (Signed)
Pain clinic referral made.  Should be ok to get the weight loss Rx from her PCP.  It's not something I prescribe.

## 2019-02-11 ENCOUNTER — Ambulatory Visit (INDEPENDENT_AMBULATORY_CARE_PROVIDER_SITE_OTHER): Payer: 59 | Admitting: Family Medicine

## 2019-02-11 ENCOUNTER — Other Ambulatory Visit: Payer: Self-pay

## 2019-02-11 ENCOUNTER — Ambulatory Visit: Payer: Self-pay

## 2019-02-11 ENCOUNTER — Encounter: Payer: Self-pay | Admitting: Family Medicine

## 2019-02-11 DIAGNOSIS — M1611 Unilateral primary osteoarthritis, right hip: Secondary | ICD-10-CM

## 2019-02-11 DIAGNOSIS — M25511 Pain in right shoulder: Secondary | ICD-10-CM | POA: Diagnosis not present

## 2019-02-11 DIAGNOSIS — G8929 Other chronic pain: Secondary | ICD-10-CM | POA: Diagnosis not present

## 2019-02-11 MED ORDER — BUPIVACAINE HCL 0.25 % IJ SOLN
4.0000 mL | INTRAMUSCULAR | Status: AC | PRN
  Administered 2019-02-11: 4 mL via INTRA_ARTICULAR

## 2019-02-11 MED ORDER — HYDROCODONE-ACETAMINOPHEN 5-325 MG PO TABS: 1.0000 | ORAL_TABLET | Freq: Three times a day (TID) | ORAL | 0 refills | Status: DC | PRN

## 2019-02-11 MED ORDER — TRIAMCINOLONE ACETONIDE 40 MG/ML IJ SUSP
60.0000 mg | INTRAMUSCULAR | Status: AC | PRN
  Administered 2019-02-11: 60 mg via INTRA_ARTICULAR

## 2019-02-11 NOTE — Telephone Encounter (Signed)
I advised patient. She actually has an appointment this afternoon with Dr. Junius Roads for her hip - possible injection. Will address any further questions then.

## 2019-02-11 NOTE — Addendum Note (Signed)
Addended by: Raymondo Band on: 02/11/2019 04:26 PM   Modules accepted: Orders

## 2019-02-11 NOTE — Progress Notes (Addendum)
Office Visit Note   Patient: Virginia Morgan           Date of Birth: 1956-01-03           MRN: [id number] Visit Date: 02/11/2019 Requested by: Dineen Kid, MD [address] [place],  [place] [address] PCP: Dineen Kid, MD  Subjective: Chief Complaint  Patient presents with  . Right Hip - Pain    Requesting a cortisone injection.    HPI: She is here with persistent right hip pain.  She recently went on a vacation with her friends but was unable to do anything because of because of pain.  She could not bathe the entire weekend.  She was constantly afraid of falling.  She is very upset, in tears today.  She cannot have bariatric surgery for about a year.  Then it will take probably another year before she loses enough weight to have a hip replacement.  Tramadol hardly gives any relief.               ROS: No fevers or chills.  All other systems were reviewed and are negative.  Objective: Vital Signs: There were no vitals taken for this visit.  Physical Exam:  General:  Alert and oriented, in no acute distress. Pulm:  Breathing unlabored. Psy:  Normal mood, congruent affect.  Right hip: Tender laterally near the greater trochanter, but most pain is with passive flexion and internal rotation.  Imaging: None today.  Assessment & Plan: 1.  End-stage right hip DJD with super morbid obesity -We will refer her to Dr. Ernestina Patches for fluoroscopically guided hip injection. -Hydrocodone only for severe pain. -Keep working on weight loss.     Procedures: Large Joint Inj: R hip joint on 02/11/2019 4:34 PM Indications: diagnostic evaluation and pain Details: 22 G 3.5 in needle, fluoroscopy-guided anterior approach  Arthrogram: No  Medications: 4 mL bupivacaine 0.25 %; 60 mg triamcinolone acetonide 40 MG/ML Outcome: tolerated well, no immediate complications  There was excellent flow of contrast producing a partial arthrogram of the hip. The patient did have relief of anterior but not  posterior symptoms during the anesthetic phase of the injection. Procedure, treatment alternatives, risks and benefits explained, specific risks discussed. Consent was given by the patient. Immediately prior to procedure a time out was called to verify the correct patient, procedure, equipment, support staff and site/side marked as required. Patient was prepped and draped in the usual sterile fashion.      No notes on file     PMFS History: Patient Active Problem List   Diagnosis Date Noted  . Unilateral primary osteoarthritis, right hip 12/21/2018  . Varicose veins of right lower extremity with complications 99991111  . Acute bronchitis 12/28/2011  . Hypoxia 12/27/2011  . Dyspnea 12/27/2011  . Tachycardia 12/27/2011   Past Medical History:  Diagnosis Date  . Breast cancer ([place])    radiation  . DVT (deep venous thrombosis) ([place])   . Hypertension   . Pneumonia   . Superficial thrombophlebitis of right leg   . Venous insufficiency of right leg     Family History  Problem Relation Age of Onset  . Diabetes Mother   . Cancer Mother   . Hypertension Other   . Coronary artery disease Other     Past Surgical History:  Procedure Laterality Date  . APPENDECTOMY    . Left lumpectomy    . MANDIBLE FRACTURE SURGERY     Social History   Occupational History  .  Not on file  Tobacco Use  . Smoking status: Former Research scientist (life sciences)  . Smokeless tobacco: Former Network engineer and Sexual Activity  . Alcohol use: Yes    Comment: Occasional Wine  . Drug use: No  . Sexual activity: Never

## 2019-02-14 ENCOUNTER — Encounter: Payer: Self-pay | Admitting: Family Medicine

## 2019-02-20 ENCOUNTER — Encounter: Payer: Self-pay | Admitting: Family Medicine

## 2019-02-20 MED ORDER — HYDROCODONE-ACETAMINOPHEN 5-325 MG PO TABS: 1.0000 | ORAL_TABLET | Freq: Two times a day (BID) | ORAL | 0 refills | Status: DC | PRN

## 2019-03-07 ENCOUNTER — Telehealth: Payer: Self-pay | Admitting: Family Medicine

## 2019-03-07 NOTE — Telephone Encounter (Signed)
Yes, if possible please try a different clinic.

## 2019-03-07 NOTE — Telephone Encounter (Signed)
I called and spoke with the patient. Per her chart, her pain management referral was denied - she had not been notified of this. She is asking what happens now. The hip injection by Dr. Ernestina Patches did not help. She is taking norco bid and baclofen just at bedtime. Should we try to refer somewhere else?

## 2019-03-07 NOTE — Telephone Encounter (Signed)
Pt called in requesting a call back from terri in regards to a referral to pain management.  Please give her a call [phone]

## 2019-03-08 NOTE — Telephone Encounter (Signed)
Can you use the same referral for pain management and just change the location, or do we need to put in a new order? (Patient is aware of the denial from the first pain management location tried).

## 2019-03-08 NOTE — Telephone Encounter (Signed)
I have changed the referral to go to [facility], referral will need to be worked/notes faxed.

## 2019-03-28 ENCOUNTER — Telehealth: Payer: Self-pay | Admitting: Family Medicine

## 2019-03-28 NOTE — Telephone Encounter (Signed)
I called and advised the patient the referral to [facility] is still pending.  Patient requests a refill on hydrocodone. She said she is feeling some better, unless it is a rainy day. The hydrocodone does control the pain better than the tramadol. There have been some days that she did not need any pain medication at all. She does continue to take baclofen just at night, occasionally along with the pain medication.

## 2019-03-28 NOTE — Telephone Encounter (Signed)
Patient called stating that she has not received a call from the pain management facility that Dr. Junius Roads was going to refer her to and also is requesting an RX refill on the Tramadol.  CB#[phone].  Thank you.

## 2019-03-29 MED ORDER — HYDROCODONE-ACETAMINOPHEN 5-325 MG PO TABS: 1.0000 | ORAL_TABLET | Freq: Two times a day (BID) | ORAL | 0 refills | Status: DC | PRN

## 2019-03-29 NOTE — Telephone Encounter (Signed)
I called and advised the patient the hydrocodone was sent in to her pharmacy.

## 2019-03-29 NOTE — Telephone Encounter (Signed)
Rx sent 

## 2019-04-11 ENCOUNTER — Other Ambulatory Visit: Payer: Self-pay

## 2019-04-11 DIAGNOSIS — Z20822 Contact with and (suspected) exposure to covid-19: Secondary | ICD-10-CM

## 2019-04-14 ENCOUNTER — Encounter: Payer: Self-pay | Admitting: Family Medicine

## 2019-04-14 LAB — NOVEL CORONAVIRUS, NAA: SARS-CoV-2, NAA: DETECTED — AB

## 2019-04-15 ENCOUNTER — Other Ambulatory Visit: Payer: Self-pay | Admitting: Nurse Practitioner

## 2019-04-15 ENCOUNTER — Telehealth: Payer: Self-pay | Admitting: Nurse Practitioner

## 2019-04-15 DIAGNOSIS — U071 COVID-19: Secondary | ICD-10-CM

## 2019-04-15 DIAGNOSIS — Z6841 Body Mass Index (BMI) 40.0 and over, adult: Secondary | ICD-10-CM

## 2019-04-15 NOTE — Progress Notes (Signed)
  I connected by phone with Virginia Morgan on 04/15/2019 at 3:41 PM to discuss the potential use of an new treatment for mild to moderate COVID-19 viral infection in non-hospitalized patients.  This patient is a 63 y.o. female that meets the FDA criteria for Emergency Use Authorization of bamlanivimab or casirivimab\imdevimab.  Has a (+) direct SARS-CoV-2 viral test result  Has mild or moderate COVID-19   Is ? 62 years of age and weighs ? 40 kg  Is NOT hospitalized due to COVID-19  Is NOT requiring oxygen therapy or requiring an increase in baseline oxygen flow rate due to COVID-19  Is within 10 days of symptom onset  Has at least one of the high risk factor(s) for progression to severe COVID-19 and/or hospitalization as defined in EUA.  Specific high risk criteria : BMI >/= 35 Patient is being managed for obesity and the following problems:  Patient Active Problem List   Diagnosis Date Noted  . Unilateral primary osteoarthritis, right hip 12/21/2018  . Varicose veins of right lower extremity with complications 99991111  . Acute bronchitis 12/28/2011  . Hypoxia 12/27/2011  . Dyspnea 12/27/2011  . Tachycardia 12/27/2011    I have spoken and communicated the following to the patient or parent/caregiver:  1. FDA has authorized the emergency use of bamlanivimab for the treatment of mild to moderate COVID-19 in adults and pediatric patients with positive results of direct SARS-CoV-2 viral testing who are 60 years of age and older weighing at least 40 kg, and who are at high risk for progressing to severe COVID-19 and/or hospitalization.  2. The significant known and potential risks and benefits of bamlanivimab, and the extent to which such potential risks and benefits are unknown.  3. Information on available alternative treatments and the risks and benefits of those alternatives, including clinical trials.  4. Patients treated with bamlanivimab should continue to self-isolate and  use infection control measures (e.g., wear mask, isolate, social distance, avoid sharing personal items, clean and disinfect "high touch" surfaces, and frequent handwashing) according to CDC guidelines.   5. The patient or parent/caregiver has the option to accept or refuse bamlanivimab.  After reviewing this information with the patient, The patient agreed to proceed with receiving the infusion of bamlanivimab and will be provided a copy of the Fact sheet prior to receiving the infusion.Fenton Foy 04/15/2019 3:41 PM

## 2019-04-15 NOTE — Telephone Encounter (Signed)
Called to Discuss with patient about Covid symptoms and the use of bamlanivimab, a monoclonal antibody infusion for those with mild to moderate Covid symptoms and at a high risk of hospitalization.     Pt is qualified for this infusion at the [facility] infusion center due to co-morbid conditions and/or a member of an at-risk group.  Patient is at due to obesity.    Unable to reach pt

## 2019-04-17 ENCOUNTER — Ambulatory Visit (HOSPITAL_COMMUNITY)
Admission: RE | Admit: 2019-04-17 | Discharge: 2019-04-17 | Disposition: A | Discharge status: Home / Self Care | Payer: 59 | Source: Ambulatory Visit | Attending: Pulmonary Disease | Admitting: Pulmonary Disease

## 2019-04-17 DIAGNOSIS — Z6841 Body Mass Index (BMI) 40.0 and over, adult: Secondary | ICD-10-CM | POA: Diagnosis present

## 2019-04-17 DIAGNOSIS — U071 COVID-19: Secondary | ICD-10-CM | POA: Diagnosis present

## 2019-04-17 MED ORDER — SODIUM CHLORIDE 0.9 % IV SOLN: INTRAVENOUS | Status: DC | PRN

## 2019-04-17 MED ORDER — FAMOTIDINE IN NACL 20-0.9 MG/50ML-% IV SOLN: 20.0000 mg | Freq: Once | INTRAVENOUS | Status: DC | PRN

## 2019-04-17 MED ORDER — DIPHENHYDRAMINE HCL 50 MG/ML IJ SOLN: 50.0000 mg | Freq: Once | INTRAMUSCULAR | Status: DC | PRN

## 2019-04-17 MED ORDER — METHYLPREDNISOLONE SODIUM SUCC 125 MG IJ SOLR: 125.0000 mg | Freq: Once | INTRAMUSCULAR | Status: DC | PRN

## 2019-04-17 MED ORDER — EPINEPHRINE 0.3 MG/0.3ML IJ SOAJ: 0.3000 mg | Freq: Once | INTRAMUSCULAR | Status: DC | PRN

## 2019-04-17 MED ORDER — SODIUM CHLORIDE 0.9 % IV SOLN
700.0000 mg | Freq: Once | INTRAVENOUS | Status: AC
  Administered 2019-04-17: 700 mg via INTRAVENOUS
  Filled 2019-04-17: qty 20

## 2019-04-17 MED ORDER — ALBUTEROL SULFATE HFA 108 (90 BASE) MCG/ACT IN AERS: 2.0000 | INHALATION_SPRAY | Freq: Once | RESPIRATORY_TRACT | Status: DC | PRN

## 2019-04-17 NOTE — Progress Notes (Signed)
  Diagnosis: COVID-19  Physician: Dr. Joya Gaskins  Procedure: [facility] Infusion Clinic Med: bamlanivimab infusion - Provided patient with bamlanimivab fact sheet for patients, parents and caregivers prior to infusion.  Complications: No immediate complications noted.  Discharge: Discharged home   Virginia Morgan 04/17/2019  Patient ID: Virginia Morgan, female   DOB: 11/25/1955, 63 y.o.   MRN: [id number]

## 2019-05-06 ENCOUNTER — Telehealth: Payer: Self-pay | Admitting: Family Medicine

## 2019-05-06 NOTE — Telephone Encounter (Signed)
Please advise 

## 2019-05-06 NOTE — Telephone Encounter (Signed)
Pt called in requesting a refill on her medication Hydrocodone and Baclofen, pt says she had to cancel her pain management appt due to having covid but she will be rescheduling her appt today just needs a refill to last till scheduled appt. Please have that sent to [facility] on [address].    [id number]

## 2019-05-07 MED ORDER — HYDROCODONE-ACETAMINOPHEN 5-325 MG PO TABS: 1.0000 | ORAL_TABLET | Freq: Two times a day (BID) | ORAL | 0 refills | Status: DC | PRN

## 2019-05-07 MED ORDER — BACLOFEN 10 MG PO TABS: 10.0000 mg | ORAL_TABLET | Freq: Three times a day (TID) | ORAL | 3 refills | Status: AC | PRN

## 2019-05-07 NOTE — Telephone Encounter (Signed)
Sent!

## 2019-05-07 NOTE — Telephone Encounter (Signed)
I called and advised the patient. She reported she is feeling better post covid.

## 2019-05-30 ENCOUNTER — Other Ambulatory Visit: Payer: Self-pay

## 2019-05-30 ENCOUNTER — Telehealth: Payer: Self-pay | Admitting: Family Medicine

## 2019-05-30 DIAGNOSIS — M25561 Pain in right knee: Secondary | ICD-10-CM

## 2019-05-30 DIAGNOSIS — M1611 Unilateral primary osteoarthritis, right hip: Secondary | ICD-10-CM

## 2019-05-30 DIAGNOSIS — G8929 Other chronic pain: Secondary | ICD-10-CM

## 2019-05-30 NOTE — Telephone Encounter (Signed)
Does she need a new referral from [place]? The patient could not keep her original appointment with pain management at [facility] because she had covid at that time.

## 2019-05-30 NOTE — Telephone Encounter (Signed)
Patient stated was supposed to have a referral to go to Pain Mgmt. Never made it and need new referral to go.  Please call patient to advise,  [phone]    `

## 2019-05-30 NOTE — Telephone Encounter (Signed)
Please put in new referral thanks

## 2019-05-30 NOTE — Telephone Encounter (Signed)
New referral has been placed. I left this message on the patient's mobile voice mail.

## 2019-06-20 ENCOUNTER — Telehealth: Payer: Self-pay | Admitting: Family Medicine

## 2019-06-20 NOTE — Telephone Encounter (Signed)
Patient called and requested Hydrocodone.  \Please call patient to advise. [phone]

## 2019-06-20 NOTE — Telephone Encounter (Signed)
Please advise 

## 2019-06-21 MED ORDER — HYDROCODONE-ACETAMINOPHEN 5-325 MG PO TABS: 1.0000 | ORAL_TABLET | Freq: Two times a day (BID) | ORAL | 0 refills | Status: DC | PRN

## 2019-06-21 NOTE — Telephone Encounter (Signed)
I called and advised the patient. She said her appointment with pain management is in 2 weeks.

## 2019-06-21 NOTE — Telephone Encounter (Signed)
Rx sent 

## 2019-07-15 ENCOUNTER — Telehealth: Payer: Self-pay

## 2019-07-15 NOTE — Telephone Encounter (Signed)

## 2019-07-16 ENCOUNTER — Encounter: Payer: Self-pay | Admitting: *Deleted

## 2019-07-16 ENCOUNTER — Encounter: Payer: Self-pay | Admitting: Student

## 2019-07-16 ENCOUNTER — Telehealth (INDEPENDENT_AMBULATORY_CARE_PROVIDER_SITE_OTHER): Payer: 59 | Admitting: Student

## 2019-07-16 VITALS — BP 150/70 | Ht 64.0 in | Wt 370.0 lb

## 2019-07-16 DIAGNOSIS — I10 Essential (primary) hypertension: Secondary | ICD-10-CM

## 2019-07-16 DIAGNOSIS — I340 Nonrheumatic mitral (valve) insufficiency: Secondary | ICD-10-CM | POA: Diagnosis not present

## 2019-07-16 DIAGNOSIS — R600 Localized edema: Secondary | ICD-10-CM | POA: Diagnosis not present

## 2019-07-16 MED ORDER — TORSEMIDE 20 MG PO TABS: 20.0000 mg | ORAL_TABLET | Freq: Every day | ORAL | 3 refills | Status: DC | PRN

## 2019-07-16 NOTE — Progress Notes (Signed)
Virtual Visit via Telephone Note   This visit type was conducted due to national recommendations for restrictions regarding the COVID-19 Pandemic (e.g. social distancing) in an effort to limit this patient's exposure and mitigate transmission in our community.  Due to her co-morbid illnesses, this patient is at least at moderate risk for complications without adequate follow up.  This format is felt to be most appropriate for this patient at this time.  The patient did not have access to video technology/had technical difficulties with video requiring transitioning to audio format only (telephone).  All issues noted in this document were discussed and addressed.  No physical exam could be performed with this format.  Please refer to the patient's chart for her  consent to telehealth for [facility].   The patient was identified using 2 identifiers.  Date:  07/16/2019   ID:  Virginia Morgan, DOB [birth year], MRN [id number]  Patient Location: Home Provider Location: Office  PCP:  Jenny Reichmann, PA-C Cardiologist:  Jenkins Rouge, MD  Electrophysiologist:  None   Evaluation Performed:  Follow-Up Visit  Chief Complaint:  57-month visit  History of Present Illness:    Virginia Morgan is a 64 y.o. female with past medical history of HTN, PVD (known occlusion of right great saphenous vein), history of DVT and history of breast cancer who presents to the office today for 13-month follow-up.   She was last examined by Dr. Johnsie Cancel in 11/2018 for evaluation of worsening dyspnea on exertion and lower extremity edema. Weight was at 366 lbs and it felt that her edema was multifactorial in the setting of her obesity, prior DVT and phlebitis with consideration of adding PRN Lasix once her diuretic regimen had been clarified. An echocardiogram was ordered and showed a preserved EF of 55 to 60% with no regional wall motion abnormalities.  She did have trivial aortic regurgitation and mild mitral valve  regurgitation but no significant abnormalities. RV function was normal.  In talking with the patient today, she does report she was diagnosed with COVID-19 in 04/2019 and overall had a mild case. She did receive an antibody infusion at [facility]. Says she lost her sense of taste and smell but these returned within a few weeks.  She continues to have significant hip pain and knee pain with known arthritis. She is being followed by Orthopedics along with Pain Management but is not felt to be a surgical candidate at this time due to her weight. She reports her arthritis is typically worse with rainy weather.  Over the past several months, she has noticed worsening lower extremity edema along both lower extremities. This is along her entire lower leg and is not isolated to a joint. She is unable to wear compression stockings but does try to elevate her legs when able. She has a prescription for as needed Lasix and takes this 3-4 times per week but reports this no longer helps with her symptoms.  She denies any associated orthopnea, PND, chest pain or palpitations.  She does have baseline dyspnea on exertion which has overall been stable for the past few years.  The patient does not have symptoms concerning for COVID-19 infection (fever, chills, cough, or new shortness of breath).    Past Medical History:  Diagnosis Date  . Breast cancer ([place])    radiation  . DVT (deep venous thrombosis) ([place])   . Hypertension   . Pneumonia   . Superficial thrombophlebitis of right leg   . Venous  insufficiency of right leg    Past Surgical History:  Procedure Laterality Date  . APPENDECTOMY    . Left lumpectomy    . MANDIBLE FRACTURE SURGERY       Current Meds  Medication Sig  . ALPRAZolam (XANAX) 0.5 MG tablet Take 0.5 mg by mouth 3 (three) times daily as needed for anxiety.  Marland Kitchen atenolol (TENORMIN) 50 MG tablet Take 50 mg by mouth daily.  . baclofen (LIORESAL) 10 MG tablet Take 1 tablet (10 mg total) by  mouth 3 (three) times daily as needed for muscle spasms.  . busPIRone (BUSPAR) 10 MG tablet TK 1 T PO TID  . diclofenac (VOLTAREN) 75 MG EC tablet Take 1 tablet (75 mg total) by mouth 2 (two) times daily as needed.  Marland Kitchen FLUoxetine (PROZAC) 20 MG tablet Take 20 mg by mouth daily.  . Glucosamine Sulfate 1000 MG CAPS Take 1 capsule (1,000 mg total) by mouth 2 (two) times daily.  Marland Kitchen losartan-hydrochlorothiazide (HYZAAR) 100-25 MG tablet Take 1 tablet by mouth daily.  Marland Kitchen oxyCODONE-acetaminophen (PERCOCET/ROXICET) 5-325 MG tablet Take 1 tablet by mouth 3 (three) times daily as needed.  . [DISCONTINUED] HYDROcodone-acetaminophen (NORCO/VICODIN) 5-325 MG tablet Take 1 tablet by mouth 2 (two) times daily as needed for moderate pain.  . [DISCONTINUED] nabumetone (RELAFEN) 750 MG tablet Take 1 tablet (750 mg total) by mouth 2 (two) times daily as needed.  . [DISCONTINUED] oxcarbazepine (TRILEPTAL) 600 MG tablet Take 600 mg by mouth 2 (two) times daily.   . [DISCONTINUED] traMADol (ULTRAM) 50 MG tablet Take 1 tablet (50 mg total) by mouth every 6 (six) hours as needed.     Allergies:   Penicillins, Bee venom, and Sulfa antibiotics   Social History   Tobacco Use  . Smoking status: Former Smoker    Quit date: 05/17/1998    Years since quitting: 21.1  . Smokeless tobacco: Former Network engineer Use Topics  . Alcohol use: Yes    Comment: Occasional Wine  . Drug use: No     Family Hx: The patient's family history includes Cancer in her mother; Coronary artery disease in an other family member; Diabetes in her mother; Hypertension in an other family member.  ROS:   Please see the history of present illness.     All other systems reviewed and are negative.   Prior CV studies:   The following studies were reviewed today:  Echocardiogram: 12/2018 IMPRESSIONS    1. The left ventricle has normal systolic function, with an ejection  fraction of 55-60%. The cavity size was normal. Left ventricular  diastolic  parameters were normal. No evidence of left ventricular regional wall  motion abnormalities.  2. The right ventricle has normal systolic function. The cavity was  normal. There is no increase in right ventricular wall thickness.  3. Left atrial size was mildly dilated.  4. The aortic valve is tricuspid. Aortic valve regurgitation is trivial  by color flow Doppler. Mild aortic annular calcification noted.  5. The mitral valve is grossly normal. Mild thickening of the mitral  valve leaflet. There is mild mitral annular calcification present.  6. The tricuspid valve is grossly normal.  7. The aorta is normal in size and structure.   Labs/Other Tests and Data Reviewed:    EKG:  No ECG reviewed.  Recent Labs: No results found for requested labs within last 8760 hours.   Recent Lipid Panel No results found for: CHOL, TRIG, HDL, CHOLHDL, LDLCALC, LDLDIRECT  Wt Readings from  Last 3 Encounters:  07/16/19 (!) 370 lb (167.8 kg)  11/23/18 (!) 366 lb (166 kg)  07/01/18 (!) 380 lb (172.4 kg)     Objective:    Vital Signs:  BP (!) 150/70   Ht 5\' 4"  (1.626 m)   Wt (!) 370 lb (167.8 kg)   BMI 63.51 kg/m    General: Pleasant female sounding in NAD Psych: Normal affect. Neuro: Alert and oriented X 3.  Lungs:  Resp regular and unlabored while talking on the phone.   ASSESSMENT & PLAN:    1. Lower Extremity Edema - She has chronic lower extremity edema which is likely secondary to known occlusion of her right great saphenous vein and history of DVT. She has a prescription for Lasix (patient unsure of dosing) and takes this 3-4 times per week but reports minimal improvement in her symptoms over the past several months. - Given her body habitus, I recommended that we try Torsemide 20 mg as needed for improved bioavailability. If she finds that symptoms do improve with this and she needs to take this more regularly, would recommend discontinuing HCTZ to avoid dual-diuretic  therapy with close follow-up BMET. She did have recent labs with her PCP and will request a copy of records.  - She has been unable to wear compression stockings. The importance of limiting sodium and fluid intake were reviewed.  2. HTN - BP was elevated at 150/70 on most recent check but she reports this has overall been well controlled when checked at office visits. She has a wrist cuff which is old and unsure of the accuracy. Will see if an arm cuff can be provided through Social Work. I did encourage her to keep a BP log.  - remains on Atenolol 50mg  daily and Losartan-HCTZ 100-25mg  daily. Will switch to PRN Torsemide as outlined above and if she ends up requiring this on a daily basis, would stop HCTZ.   3. Mitral Valve Regurgitation - Mild by most recent echocardiogram in 2020. Will continue to follow.   COVID-19 Education: The signs and symptoms of COVID-19 were discussed with the patient and how to seek care for testing (follow up with PCP or arrange E-visit). The importance of social distancing was discussed today.  Time:   Today, I have spent 19 minutes with the patient with telehealth technology discussing the above problems.     Medication Adjustments/Labs and Tests Ordered: Current medicines are reviewed at length with the patient today.  Concerns regarding medicines are outlined above.   Tests Ordered: No orders of the defined types were placed in this encounter.   Medication Changes: Meds ordered this encounter  Medications  . DISCONTD: torsemide (DEMADEX) 20 MG tablet    Sig: Take 1 tablet (20 mg total) by mouth daily as needed (edema).    Dispense:  90 tablet    Refill:  3    Order Specific Question:   Supervising Provider    Answer:   Dorothy Spark [id number]  . torsemide (DEMADEX) 20 MG tablet    Sig: Take 1 tablet (20 mg total) by mouth daily as needed (edema).    Dispense:  90 tablet    Refill:  3    Stopping LASIX.    Order Specific Question:    Supervising Provider    Answer:   Dorothy Spark [id number]    Follow Up:  In Person in 3 month(s)  Signed, Erma Heritage, PA-C  07/16/2019 4:53 PM    [facility]

## 2019-07-16 NOTE — Patient Instructions (Signed)
Medication Instructions:  Your physician has recommended you make the following change in your medication:  Stop Taking Lasix  Start Torsemide 20 mg Daily as needed for edema   *If you need a refill on your cardiac medications before your next appointment, please call your pharmacy*   Lab Work: NONE  If you have labs (blood work) drawn today and your tests are completely normal, you will receive your results only by: Marland Kitchen MyChart Message (if you have MyChart) OR . A paper copy in the mail If you have any lab test that is abnormal or we need to change your treatment, we will call you to review the results.   Testing/Procedures: NONE    Follow-Up: At [facility], you and your health needs are our priority.  As part of our continuing mission to provide you with exceptional heart care, we have created designated Provider Care Teams.  These Care Teams include your primary Cardiologist (physician) and Advanced Practice Providers (APPs -  Physician Assistants and Nurse Practitioners) who all work together to provide you with the care you need, when you need it.  We recommend signing up for the patient portal called "MyChart".  Sign up information is provided on this After Visit Summary.  MyChart is used to connect with patients for Virtual Visits (Telemedicine).  Patients are able to view lab/test results, encounter notes, upcoming appointments, etc.  Non-urgent messages can be sent to your provider as well.   To learn more about what you can do with MyChart, go to [email or web address].    Your next appointment:   3-4 month(s)  The format for your next appointment:   In Person  Provider:   Jenkins Rouge, MD   Other Instructions Thank you for choosing [facility]!

## 2019-07-17 ENCOUNTER — Telehealth: Payer: Self-pay | Admitting: Licensed Clinical Social Worker

## 2019-07-17 NOTE — Telephone Encounter (Signed)
CSW referred to assist patient with obtaining a BP cuff. CSW contacted patient to inform cuff will be delivered to home. Patient grateful for support and assistance. CSW available as needed. Jackie Rodell Marrs, LCSW, CCSW-MCS [phone]  

## 2019-08-29 ENCOUNTER — Telehealth: Payer: Self-pay | Admitting: Family Medicine

## 2019-08-29 NOTE — Telephone Encounter (Signed)
Debbie-nurse with [employer] need patient to contact her at her next appointment with Dr. Junius Roads. Jackelyn Poling was unable to contact patient via phone.  The number is [phone] Ext: [phone]

## 2019-08-29 NOTE — Telephone Encounter (Signed)
I called and left Debbie's information, & the request to call her back, on the patient's mobile voice mail. The patient does not currently have an appointment coming up with Dr. Junius Roads.

## 2019-09-13 ENCOUNTER — Other Ambulatory Visit: Payer: Self-pay | Admitting: Family Medicine

## 2019-09-13 MED ORDER — DICLOFENAC SODIUM 75 MG PO TBEC: 75.0000 mg | DELAYED_RELEASE_TABLET | Freq: Two times a day (BID) | ORAL | 6 refills | Status: DC | PRN

## 2019-09-20 ENCOUNTER — Emergency Department
Admission: EM | Admit: 2019-09-20 | Discharge: 2019-09-20 | Disposition: A | Discharge status: Home / Self Care | Payer: 59 | Attending: Emergency Medicine | Admitting: Emergency Medicine

## 2019-09-20 ENCOUNTER — Other Ambulatory Visit: Payer: Self-pay

## 2019-09-20 ENCOUNTER — Emergency Department: Payer: 59

## 2019-09-20 DIAGNOSIS — M545 Low back pain, unspecified: Secondary | ICD-10-CM

## 2019-09-20 DIAGNOSIS — M1611 Unilateral primary osteoarthritis, right hip: Secondary | ICD-10-CM | POA: Insufficient documentation

## 2019-09-20 DIAGNOSIS — Y939 Activity, unspecified: Secondary | ICD-10-CM | POA: Diagnosis not present

## 2019-09-20 DIAGNOSIS — Y929 Unspecified place or not applicable: Secondary | ICD-10-CM | POA: Diagnosis not present

## 2019-09-20 DIAGNOSIS — I10 Essential (primary) hypertension: Secondary | ICD-10-CM | POA: Diagnosis not present

## 2019-09-20 DIAGNOSIS — W19XXXA Unspecified fall, initial encounter: Secondary | ICD-10-CM | POA: Diagnosis not present

## 2019-09-20 DIAGNOSIS — Y999 Unspecified external cause status: Secondary | ICD-10-CM | POA: Insufficient documentation

## 2019-09-20 DIAGNOSIS — Z87891 Personal history of nicotine dependence: Secondary | ICD-10-CM | POA: Diagnosis not present

## 2019-09-20 DIAGNOSIS — Z79899 Other long term (current) drug therapy: Secondary | ICD-10-CM | POA: Insufficient documentation

## 2019-09-20 DIAGNOSIS — M25551 Pain in right hip: Secondary | ICD-10-CM | POA: Diagnosis present

## 2019-09-20 DIAGNOSIS — M25561 Pain in right knee: Secondary | ICD-10-CM | POA: Diagnosis not present

## 2019-09-20 MED ORDER — OXYCODONE HCL 5 MG PO TABS
10.0000 mg | ORAL_TABLET | Freq: Once | ORAL | Status: AC
  Administered 2019-09-20: 10 mg via ORAL
  Filled 2019-09-20: qty 2

## 2019-09-20 MED ORDER — OXYCODONE HCL 5 MG PO TABS: 5.0000 mg | ORAL_TABLET | Freq: Three times a day (TID) | ORAL | 0 refills | Status: DC | PRN

## 2019-09-20 NOTE — Discharge Instructions (Addendum)
Please apply heating pad to the right hip and lower back.  Use walker to help with ambulation.  Take oxycodone as prescribed for severe pain.  Please follow-up with primary care provider and orthopedist if no improvement over the next few days.  Recommend speaking with PCP on referral to weight loss clinic.  Return to the ER for any worsening symptoms or new changes in your health.

## 2019-09-20 NOTE — ED Notes (Signed)
Pt's daughter to bedside ° °

## 2019-09-20 NOTE — ED Triage Notes (Addendum)
Pt sitting in wheelchair speaking in complete sentences without difficulty. Pt reports falling when trying to get out of the car because her "leg buckled". Pt reports she fell to her bottom on the rocks. No obvious deformities noted.

## 2019-09-20 NOTE — ED Notes (Signed)
Pt reports history of episodes of numbness and weakness in R leg that sometimes causes her to fall. State this has been going on for years. Pt can wiggle toes and move leg but reports much weaker than L. States this isn't new. States daughter helps her at home. Reports inc pain in R leg and hip since fall today. Fell from standing position d/t legs weakness.

## 2019-09-20 NOTE — ED Provider Notes (Signed)
[facility] EMERGENCY DEPARTMENT Provider Note   CSN: [id number] Arrival date & time: 09/20/19  1647     History Chief Complaint  Patient presents with  . Fall    AZAILYA Morgan is a 64 y.o. female presents to the emergency department for evaluation of a fall.  Patient has a history of chronic back pain, right hip osteoarthritis as well as right knee osteoarthritis.  Patient states she was getting out of her vehicle today when her right hip gave way and she fell backwards on her buttocks, injuring her lower back, right hip and knee.  Her pain in her lower back is moderate, consistent with her occasional flareups of chronic back pain.  She also has some right groin pain consistent with her right hip arthritis.  Her knee is painful along the anterior medial and lateral joint line.  She is able to straight leg raise.  She denies any head injury, LOC, nausea, vomiting, chest pain, shortness of breath.  She currently takes oxycodone 5 to 10 mg 4 times a day as needed.  She has not had any medications for this pain.  She denies any numbness tingling radicular symptoms nor loss of bowel or bladder symptoms.  HPI     Past Medical History:  Diagnosis Date  . Breast cancer ([place])    radiation  . DVT (deep venous thrombosis) ([place])   . Hypertension   . Pneumonia   . Superficial thrombophlebitis of right leg   . Venous insufficiency of right leg     Patient Active Problem List   Diagnosis Date Noted  . Unilateral primary osteoarthritis, right hip 12/21/2018  . Varicose veins of right lower extremity with complications 99991111  . Acute bronchitis 12/28/2011  . Hypoxia 12/27/2011  . Dyspnea 12/27/2011  . Tachycardia 12/27/2011    Past Surgical History:  Procedure Laterality Date  . APPENDECTOMY    . Left lumpectomy    . MANDIBLE FRACTURE SURGERY       OB History   No obstetric history on file.     Family History  Problem Relation Age of Onset  .  Diabetes Mother   . Cancer Mother   . Hypertension Other   . Coronary artery disease Other     Social History   Tobacco Use  . Smoking status: Former Smoker    Quit date: 05/17/1998    Years since quitting: 21.3  . Smokeless tobacco: Former Network engineer Use Topics  . Alcohol use: Yes    Comment: Occasional Wine  . Drug use: No    Home Medications Prior to Admission medications   Medication Sig Start Date End Date Taking? Authorizing Provider  ALPRAZolam Duanne Moron) 0.5 MG tablet Take 0.5 mg by mouth 3 (three) times daily as needed for anxiety.    [provider]  atenolol (TENORMIN) 50 MG tablet Take 50 mg by mouth daily.    [provider]  baclofen (LIORESAL) 10 MG tablet Take 1 tablet (10 mg total) by mouth 3 (three) times daily as needed for muscle spasms. 05/07/19   Hilts, Legrand Como, MD  busPIRone (BUSPAR) 10 MG tablet TK 1 T PO TID 10/27/18   [provider]  diclofenac (VOLTAREN) 75 MG EC tablet Take 1 tablet (75 mg total) by mouth 2 (two) times daily as needed. 09/13/19   Hilts, Legrand Como, MD  FLUoxetine (PROZAC) 20 MG tablet Take 20 mg by mouth daily.    [provider]  Glucosamine Sulfate 1000  MG CAPS Take 1 capsule (1,000 mg total) by mouth 2 (two) times daily. 12/21/18   Hilts, Legrand Como, MD  losartan-hydrochlorothiazide (HYZAAR) 100-25 MG tablet Take 1 tablet by mouth daily.    [provider]  oxyCODONE (ROXICODONE) 5 MG immediate release tablet Take 1 tablet (5 mg total) by mouth every 8 (eight) hours as needed. 09/20/19 09/19/20  Duanne Guess, PA-C  oxyCODONE-acetaminophen (PERCOCET/ROXICET) 5-325 MG tablet Take 1 tablet by mouth 3 (three) times daily as needed. 07/11/19   [provider]  torsemide (DEMADEX) 20 MG tablet Take 1 tablet (20 mg total) by mouth daily as needed (edema). 07/16/19 07/10/20  Erma Heritage, PA-C    Allergies    Penicillins, Bee venom, and Sulfa antibiotics  Review of Systems   Review of  Systems  Respiratory: Negative for cough and shortness of breath.   Cardiovascular: Negative for chest pain.  Gastrointestinal: Negative for abdominal pain, nausea and vomiting.  Genitourinary: Negative for pelvic pain.  Musculoskeletal: Positive for arthralgias, back pain and gait problem. Negative for joint swelling and neck pain.  Skin: Negative for rash.  Neurological: Negative for dizziness, weakness, light-headedness, numbness and headaches.    Physical Exam Updated Vital Signs BP 115/78 (BP Location: Right Wrist)   Pulse (!) 56   Temp 98.2 F (36.8 C) (Oral)   Resp (!) 21   Ht 5\' 4"  (1.626 m)   Wt (!) 161.9 kg   SpO2 100%   BMI 61.28 kg/m   Physical Exam Constitutional:      Appearance: Normal appearance. She is well-developed. She is obese.  HENT:     Head: Normocephalic and atraumatic.  Eyes:     Conjunctiva/sclera: Conjunctivae normal.  Cardiovascular:     Rate and Rhythm: Normal rate.  Pulmonary:     Effort: Pulmonary effort is normal. No respiratory distress.  Musculoskeletal:        General: Normal range of motion.     Cervical back: Normal range of motion.     Comments: Patient able to straight leg raise right lower extremity.  She is tender palpation along the knee as well as the lateral hip.  She has 20 degrees of hip internal X rotation but does have discomfort in the groin with right hip internal rotation.  No swelling or edema throughout the lower leg.  Patient does have some tenderness along the lumbar spine and paravertebral muscles lumbar spine.  No neurovascular deficits in the lower extremities.  Sensation is intact throughout both lower extremities.  Skin:    General: Skin is warm.     Findings: No rash.  Neurological:     Mental Status: She is alert and oriented to person, place, and time.  Psychiatric:        Behavior: Behavior normal.        Thought Content: Thought content normal.     ED Results / Procedures / Treatments   Labs (all labs  ordered are listed, but only abnormal results are displayed) Labs Reviewed - No data to display  EKG None  Radiology DG Lumbar Spine 2-3 Views  Result Date: 09/20/2019 CLINICAL DATA:  Low back pain, fall EXAM: LUMBAR SPINE - 2-3 VIEW COMPARISON:  None. FINDINGS: Degenerative disc and facet disease in the lumbar spine. Slight anterolisthesis of L4 on L5. No fracture. SI joints symmetric and unremarkable. Moderate to severe degenerative changes in the hips, right greater than left. IMPRESSION: No acute bony abnormality. Electronically Signed   By: Rolm Baptise  M.D.   On: 09/20/2019 19:21   DG Knee Complete 4 Views Right  Result Date: 09/20/2019 CLINICAL DATA:  Fall, right knee pain EXAM: RIGHT KNEE - COMPLETE 4+ VIEW COMPARISON:  None. FINDINGS: No acute bony abnormality. Specifically, no fracture, subluxation, or dislocation. Joint spaces maintained. No joint effusion. IMPRESSION: No acute bony abnormality. Electronically Signed   By: Rolm Baptise M.D.   On: 09/20/2019 19:22   DG Hip Unilat W or Wo Pelvis 2-3 Views Right  Result Date: 09/20/2019 CLINICAL DATA:  Fall, right hip pain EXAM: DG HIP (WITH OR WITHOUT PELVIS) 2-3V RIGHT COMPARISON:  None. FINDINGS: Severe degenerative changes in the right hip with joint space loss, subchondral sclerosis and cyst formation and osteophyte formation. No acute fracture, subluxation or dislocation. IMPRESSION: Advanced osteoarthritis.  No acute bony abnormality. Electronically Signed   By: Rolm Baptise M.D.   On: 09/20/2019 19:21    Procedures Procedures (including critical care time)  Medications Ordered in ED Medications  oxyCODONE (Oxy IR/ROXICODONE) immediate release tablet 10 mg (10 mg Oral Given 09/20/19 1758)    ED Course  I have reviewed the triage vital signs and the nursing notes.  Pertinent labs & imaging results that were available during my care of the patient were reviewed by me and considered in my medical decision making (see chart  for details).    MDM Rules/Calculators/A&P                      64 year old female with morbid obesity presents with a mechanical fall earlier today.  No head injury, LOC, nausea or vomiting.  No chest pain or shortness of breath.  She has severe right hip osteoarthritis, states her right hip gave way and she fell onto her buttocks.  She is flared up her chronic back pain as well as her chronic hip pain.  Pain also present in her right knee.  She is able to ambulate with moderate pain with a walker.  She has no weakness or neurological deficits.  No radicular symptoms.  She is able to straight leg raise with no signs of ligamentous laxity within the knee.  X-rays of the hip, pelvis, knee and lumbar spine show no evidence of acute bony abnormality.  Patient is educated on weight loss and will follow-up with PCP/orthopedics, recommended referral to weight loss clinic as she will likely be needing a total hip replacement in the future.  She is given oxycodone for pain, will take only as needed for breakthrough pain.  She understands signs and symptoms return to the ED for. Final Clinical Impression(s) / ED Diagnoses Final diagnoses:  Fall, initial encounter  Primary osteoarthritis of right hip  Acute pain of right knee  Acute midline low back pain without sciatica    Rx / DC Orders ED Discharge Orders         Ordered    oxyCODONE (ROXICODONE) 5 MG immediate release tablet  Every 8 hours PRN     09/20/19 1944           Renata Caprice 09/20/19 1951    Drenda Freeze, MD 09/21/19 1520

## 2019-09-20 NOTE — ED Triage Notes (Signed)
First RN Note: Pt presents to ED via [facility] with c/o pain. Per EMS pt was getting out of car and her leg buckled. Per EMS pt slid down the car and landed on her buttocks. Per EMS pt c/o bilateral lower leg pain, bilateral hip pain, and back pain which is chronic to patient, per EMS pt is being followed at pain management however does not feel like it is effective. Pt 2 person standby assist from EMS stretcher to wheelchair.   VSS, pt noted to be tearful upon arrival to ED.

## 2019-10-17 ENCOUNTER — Ambulatory Visit: Payer: 59 | Admitting: Cardiovascular Disease

## 2020-04-14 ENCOUNTER — Other Ambulatory Visit: Payer: Self-pay | Admitting: Family Medicine

## 2020-04-14 DIAGNOSIS — Z1231 Encounter for screening mammogram for malignant neoplasm of breast: Secondary | ICD-10-CM

## 2020-05-28 ENCOUNTER — Other Ambulatory Visit: Payer: 59

## 2020-06-19 ENCOUNTER — Ambulatory Visit: Payer: 59 | Admitting: Family Medicine

## 2020-07-03 ENCOUNTER — Encounter: Payer: Self-pay | Admitting: Family Medicine

## 2020-07-03 ENCOUNTER — Ambulatory Visit: Payer: 59 | Admitting: Family Medicine

## 2020-07-03 ENCOUNTER — Other Ambulatory Visit: Payer: Self-pay

## 2020-07-03 VITALS — Ht 64.0 in | Wt 276.0 lb

## 2020-07-03 DIAGNOSIS — M1710 Unilateral primary osteoarthritis, unspecified knee: Secondary | ICD-10-CM | POA: Insufficient documentation

## 2020-07-03 DIAGNOSIS — E559 Vitamin D deficiency, unspecified: Secondary | ICD-10-CM | POA: Insufficient documentation

## 2020-07-03 DIAGNOSIS — M1611 Unilateral primary osteoarthritis, right hip: Secondary | ICD-10-CM | POA: Diagnosis not present

## 2020-07-03 DIAGNOSIS — I10 Essential (primary) hypertension: Secondary | ICD-10-CM | POA: Insufficient documentation

## 2020-07-03 DIAGNOSIS — F419 Anxiety disorder, unspecified: Secondary | ICD-10-CM | POA: Insufficient documentation

## 2020-07-03 DIAGNOSIS — G8929 Other chronic pain: Secondary | ICD-10-CM | POA: Insufficient documentation

## 2020-07-03 DIAGNOSIS — F32A Depression, unspecified: Secondary | ICD-10-CM | POA: Insufficient documentation

## 2020-07-03 DIAGNOSIS — M1610 Unilateral primary osteoarthritis, unspecified hip: Secondary | ICD-10-CM | POA: Insufficient documentation

## 2020-07-03 DIAGNOSIS — F324 Major depressive disorder, single episode, in partial remission: Secondary | ICD-10-CM

## 2020-07-03 DIAGNOSIS — M76899 Other specified enthesopathies of unspecified lower limb, excluding foot: Secondary | ICD-10-CM | POA: Insufficient documentation

## 2020-07-03 DIAGNOSIS — M179 Osteoarthritis of knee, unspecified: Secondary | ICD-10-CM | POA: Insufficient documentation

## 2020-07-03 MED ORDER — FLUOXETINE HCL 40 MG PO CAPS: 40.0000 mg | ORAL_CAPSULE | Freq: Every day | ORAL | 1 refills | Status: AC

## 2020-07-03 NOTE — Progress Notes (Signed)
Office Visit Note   Patient: Virginia Morgan           Date of Birth: June 20, 1955           MRN: [id number] Visit Date: 07/03/2020 Requested by: No referring provider defined for this encounter. PCP: Eunice Blase, MD  Subjective: Chief Complaint  Patient presents with  . Right Hip - Follow-up, Pain    Has lost a lot of weight since last ov. Trying hard to get her BMI in the range that she can have THA.    HPI: She is here to establish care.  She does not feel comfortable with her current PCP.  She feels like her PCP is mad at her because she has not done some of the recommended screening test.  She has done very well with weight loss.  She has lost a significant amount of weight on her own.  She is working with pain management, and is hoping to be able to have hip replacement surgery later this year.  With her weight loss her blood pressure dropped significantly and she was able to stop blood pressure medication altogether.  She is taking Prozac 30 mg daily but would like to go up to 40 mg if possible.  She is weaning herself off BuSpar.                ROS:   All other systems were reviewed and are negative.  Objective: Vital Signs: Ht 5\' 4"  (1.626 m)   Wt 276 lb (125.2 kg)   BMI 47.38 kg/m   Physical Exam:  General:  Alert and oriented, in no acute distress. Pulm:  Breathing unlabored. Psy:  Normal mood, congruent affect.    Imaging: No results found.  Assessment & Plan: 1.  End-stage right hip DJD -Continue working at weight loss.  Target weight is 233 pounds.  2.  Depression -Refilled Prozac at 40 mg daily.  3.  Colon cancer screening -Presently she is not able to do the prep for this.  Once she recovers from hip surgery, we can discuss colon cancer screening at that point.  Depending on family history we could potentially do Cologuard rather than colonoscopy.     Procedures: No procedures performed        PMFS History: Patient Active Problem List    Diagnosis Date Noted  . Vitamin D deficiency 07/03/2020  . Primary localized osteoarthritis of pelvic region and thigh 07/03/2020  . Osteoarthritis of knee 07/03/2020  . Morbid obesity ([place]) 07/03/2020  . Enthesopathy of hip region 07/03/2020  . Depression 07/03/2020  . Chronic pain 07/03/2020  . Anxiety 07/03/2020  . Unilateral primary osteoarthritis, right hip 12/21/2018  . Varicose veins of right lower extremity with complications 48/25/0037  . Acute bronchitis 12/28/2011  . Hypoxia 12/27/2011  . Dyspnea 12/27/2011  . Tachycardia 12/27/2011   Past Medical History:  Diagnosis Date  . Breast cancer ([place])    radiation  . DVT (deep venous thrombosis) ([place])   . Hypertension   . Pneumonia   . Superficial thrombophlebitis of right leg   . Venous insufficiency of right leg     Family History  Problem Relation Age of Onset  . Diabetes Mother   . Cancer Mother   . Hypertension Other   . Coronary artery disease Other     Past Surgical History:  Procedure Laterality Date  . APPENDECTOMY    . Left lumpectomy    . MANDIBLE FRACTURE SURGERY  Social History   Occupational History  . Not on file  Tobacco Use  . Smoking status: Former Smoker    Quit date: 05/17/1998    Years since quitting: 22.1  . Smokeless tobacco: Former Network engineer  . Vaping Use: Never used  Substance and Sexual Activity  . Alcohol use: Yes    Comment: Occasional Wine  . Drug use: No  . Sexual activity: Never

## 2020-07-08 ENCOUNTER — Other Ambulatory Visit: Payer: Self-pay | Admitting: Family Medicine

## 2020-07-08 DIAGNOSIS — Z1231 Encounter for screening mammogram for malignant neoplasm of breast: Secondary | ICD-10-CM

## 2020-09-02 ENCOUNTER — Ambulatory Visit
Admission: RE | Admit: 2020-09-02 | Discharge: 2020-09-02 | Disposition: A | Discharge status: Home / Self Care | Payer: 59 | Source: Ambulatory Visit | Attending: Family Medicine | Admitting: Family Medicine

## 2020-09-02 ENCOUNTER — Other Ambulatory Visit: Payer: Self-pay

## 2020-09-02 DIAGNOSIS — Z1231 Encounter for screening mammogram for malignant neoplasm of breast: Secondary | ICD-10-CM

## 2020-09-03 ENCOUNTER — Telehealth: Payer: Self-pay | Admitting: Family Medicine

## 2020-09-03 NOTE — Telephone Encounter (Signed)
Mammogram is normal

## 2020-12-10 ENCOUNTER — Encounter: Payer: Self-pay | Admitting: Family Medicine

## 2020-12-10 IMAGING — CR DG FOOT COMPLETE 3+V*L*
3 series · 3 of 3 positions shown · non-contrast
Comparison: None.

CLINICAL DATA: Left foot pain.  Rolled foot.

EXAM:
LEFT FOOT - COMPLETE 3+ VIEW

[x foot ap left]
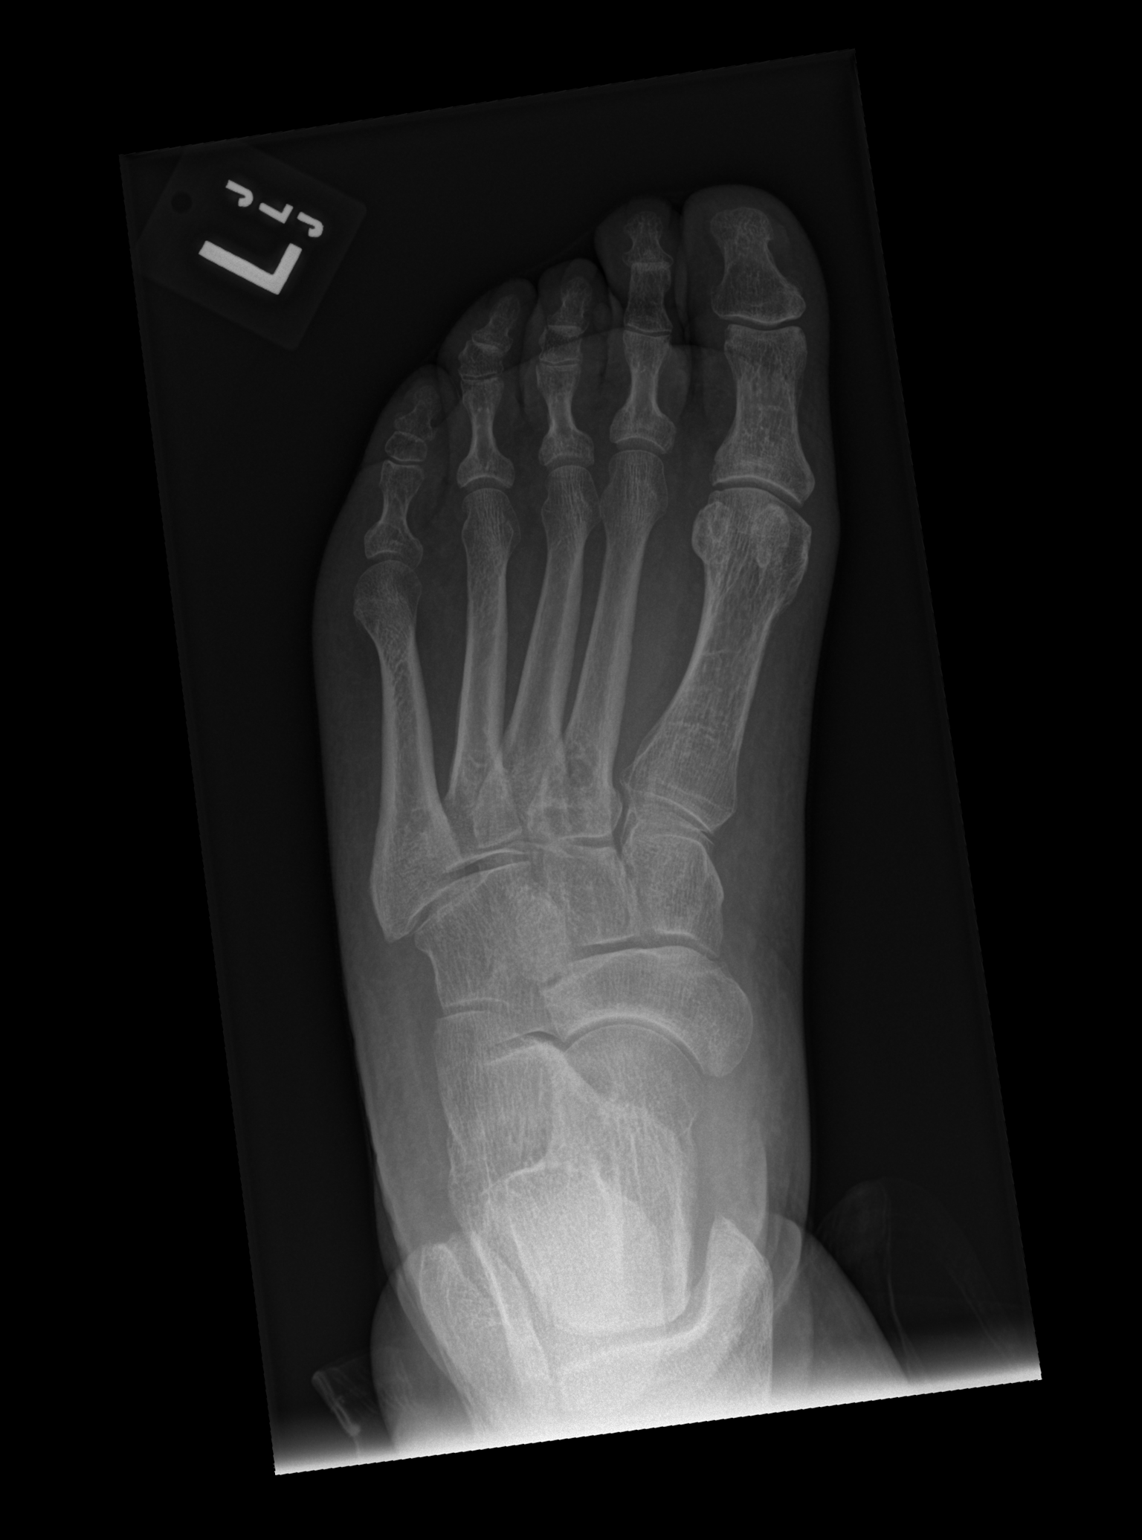

[x foot obl left]
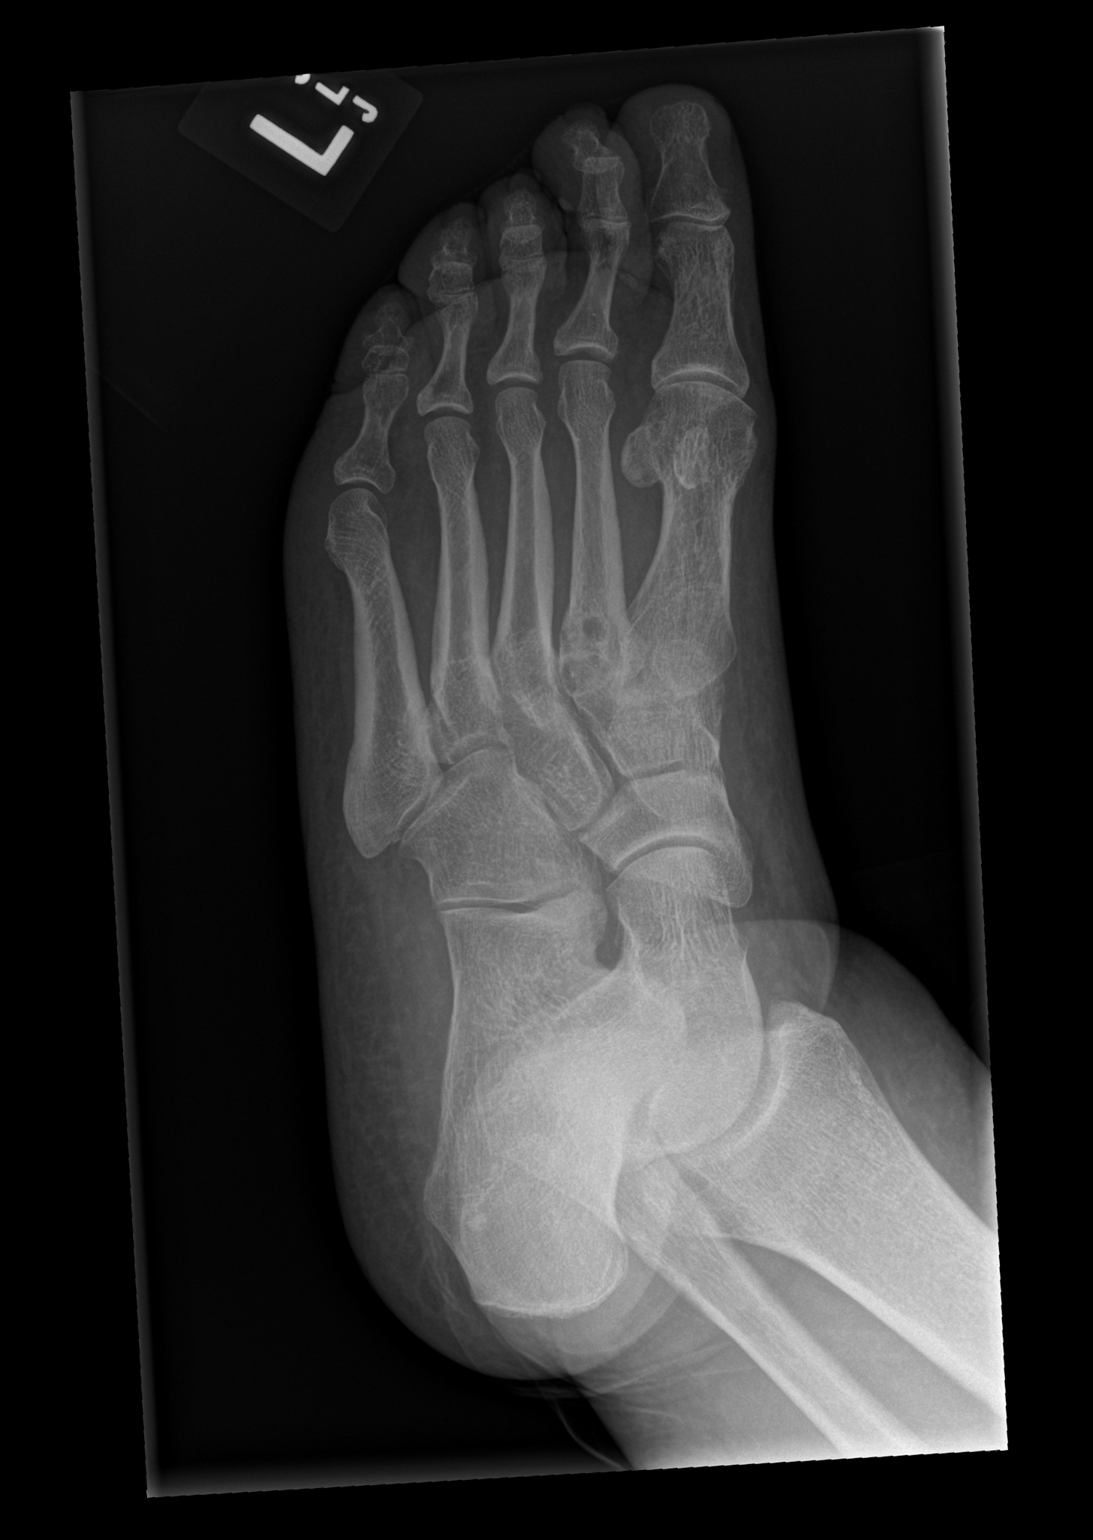

[x foot lat left]
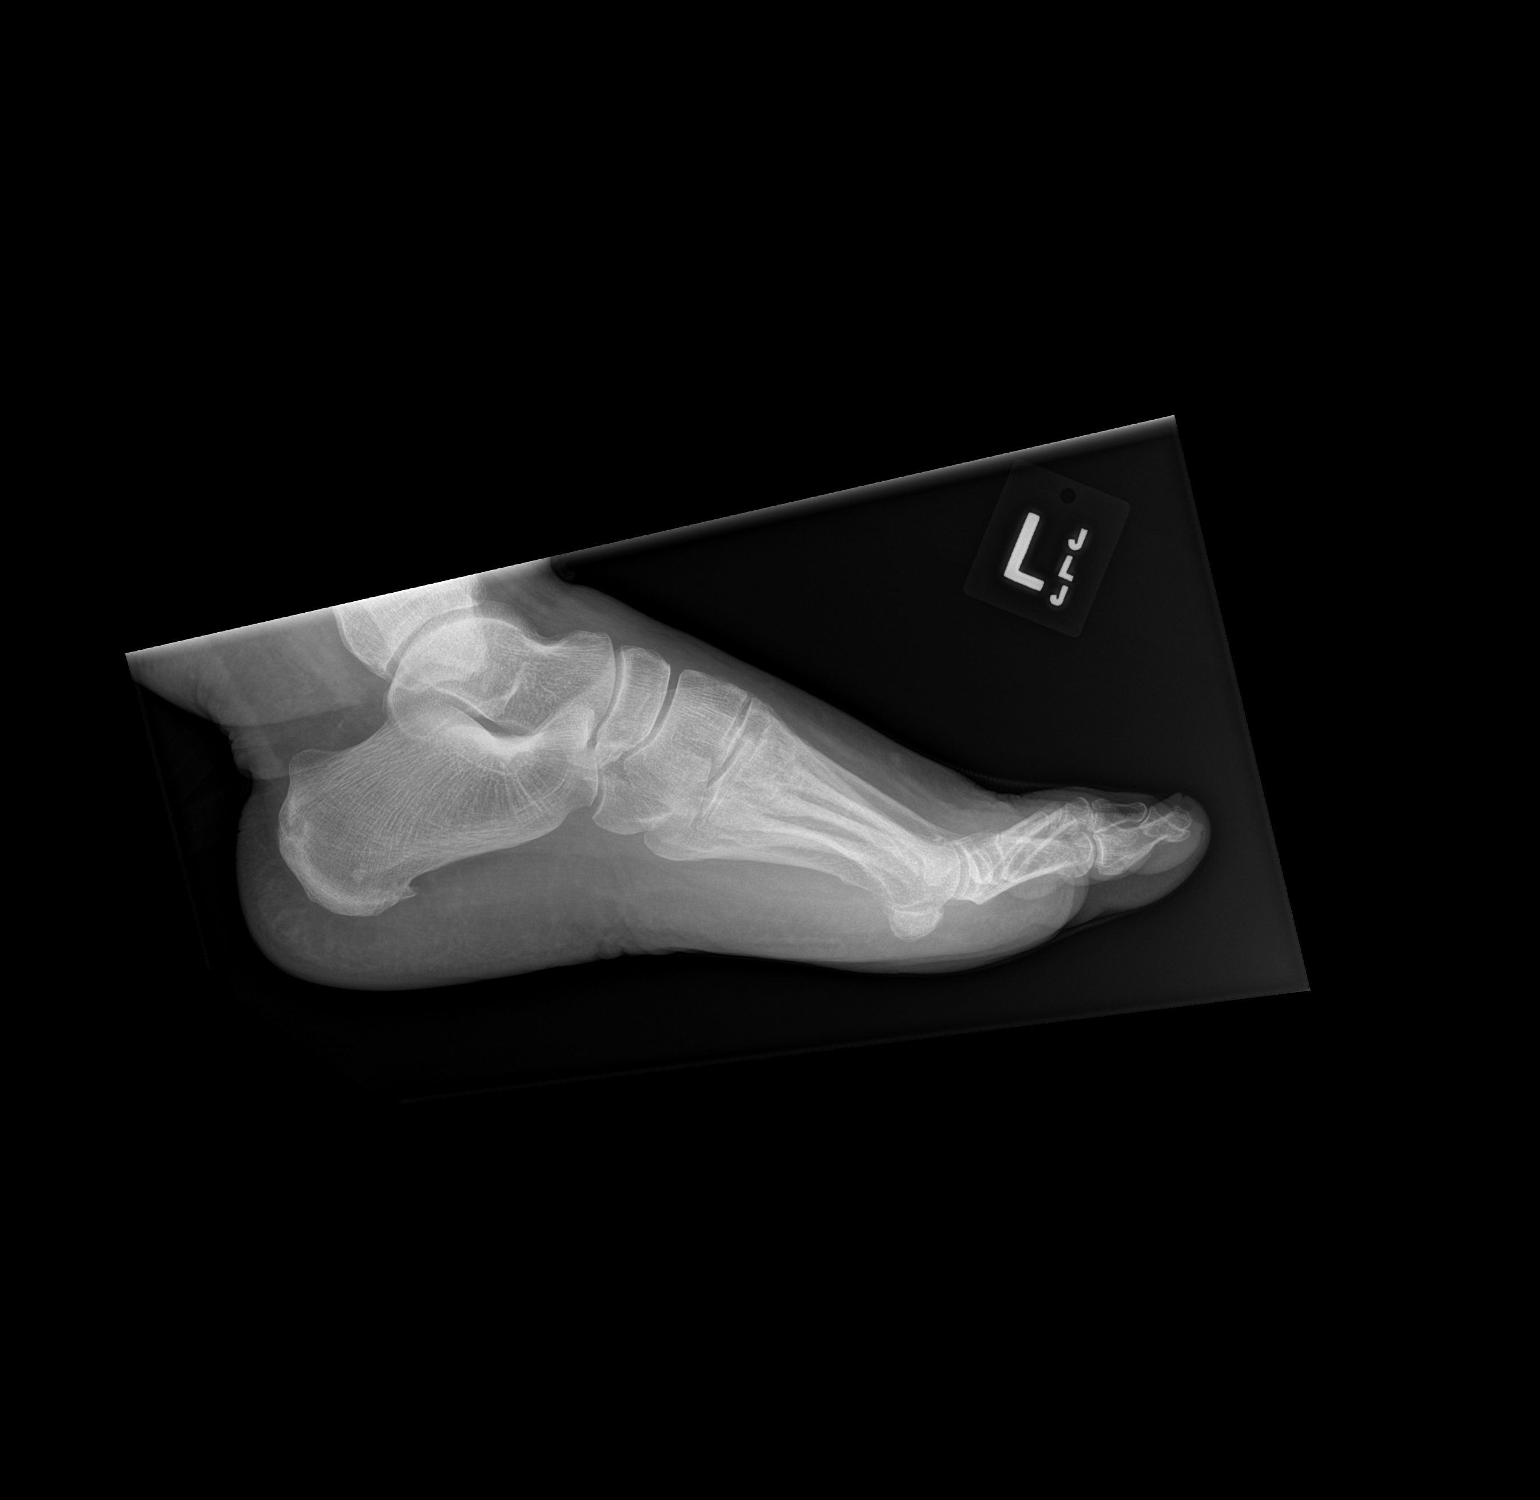

[3 of 3 positions shown; findings below may reference images not displayed]

FINDINGS: Lucency noted at the base of the left 2nd metatarsal compatible with
benign cyst. No acute bony abnormality. No fracture, subluxation or
dislocation. Soft tissues are intact.
IMPRESSION: No acute bony abnormality.

## 2020-12-14 ENCOUNTER — Encounter: Payer: Self-pay | Admitting: Family Medicine

## 2020-12-14 ENCOUNTER — Ambulatory Visit: Payer: BC Managed Care – PPO | Admitting: Family Medicine

## 2020-12-14 ENCOUNTER — Other Ambulatory Visit: Payer: Self-pay

## 2020-12-14 DIAGNOSIS — R1084 Generalized abdominal pain: Secondary | ICD-10-CM

## 2020-12-14 NOTE — Progress Notes (Signed)
Office Visit Note   Patient: Virginia Morgan           Date of Birth: 12/10/1955           MRN: [id number] Visit Date: 12/14/2020 Requested by: Eunice Blase, MD [address] [place],  [place] [address] PCP: Eunice Blase, MD  Subjective: Chief Complaint  Patient presents with   Other    Umbilical hernia (has had this since she had her appendectomy 20 years ago) - has started bothering her more over the past 4 days. Worse after eating. A lot of pressure, gas, bloating. Has diarrhea also. No fever.     HPI: She is here with abdominal pain.  She has had an umbilical hernia for about 20 years ever since her appendectomy.  It never bothered her, but in the past several months she has had intermittent pain in that area.  For the past 4 days it has been hurting constantly.  It is even worse after eating.  She tried cutting out dairy but that has not made a difference.  She has had a lot of bloating and gas pressure.  She had a couple episodes of diarrhea but nothing in the last couple days.  She is discouraged because she is hoping to have hip replacement soon.  She has finally lost almost enough weight to proceed.                ROS:   All other systems were reviewed and are negative.  Objective: Vital Signs: There were no vitals taken for this visit.  Physical Exam:  General:  Alert and oriented, in no acute distress. Pulm:  Breathing unlabored. Psy:  Normal mood, congruent affect. Skin: No erythema Abdomen: Bowel sounds are active.  She has an umbilical hernia that is not fully reducible.  She has some tenderness in the epigastric area as well, no rebound or guarding.  Imaging: No results found.  Assessment & Plan: Abdominal pain, etiology uncertain.  Could be due to umbilical hernia. -She will try Phazyme or Gas-X.  If no improvement, then proceed with CT scan.     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 99991111   Acute bronchitis 12/28/2011   Hypoxia 12/27/2011   Dyspnea 12/27/2011   Tachycardia 12/27/2011   Past Medical History:  Diagnosis Date   Breast cancer ([facility])    radiation   DVT (deep venous thrombosis) ([facility])    Hypertension    Pneumonia    Superficial thrombophlebitis of right leg    Venous insufficiency of right leg     Family History  Problem Relation Age of Onset   Diabetes Mother    Cancer Mother    Hypertension Other    Coronary artery disease Other     Past Surgical History:  Procedure Laterality Date   APPENDECTOMY     Left lumpectomy     MANDIBLE FRACTURE SURGERY     Social History   Occupational History   Not on file  Tobacco Use  Smoking status: Former    Types: Cigarettes    Quit date: 05/17/1998    Years since quitting: 22.5   Smokeless tobacco: Former  Scientific laboratory technician Use: Never used  Substance and Sexual Activity   Alcohol use: Yes    Comment: Occasional Wine   Drug use: No   Sexual activity: Never

## 2020-12-14 NOTE — Patient Instructions (Signed)
    For abdomen:  Gas-X or Phazyme

## 2020-12-21 ENCOUNTER — Encounter: Payer: Self-pay | Admitting: Family Medicine

## 2020-12-30 ENCOUNTER — Encounter: Payer: Self-pay | Admitting: Orthopaedic Surgery

## 2020-12-30 ENCOUNTER — Ambulatory Visit: Payer: BC Managed Care – PPO | Admitting: Orthopaedic Surgery

## 2020-12-30 DIAGNOSIS — M1611 Unilateral primary osteoarthritis, right hip: Secondary | ICD-10-CM

## 2020-12-30 DIAGNOSIS — M25551 Pain in right hip: Secondary | ICD-10-CM | POA: Diagnosis not present

## 2020-12-30 NOTE — Progress Notes (Signed)
Office Visit Note   Patient: Virginia Morgan           Date of Birth: 1955-11-03           MRN: [id number] Visit Date: 12/30/2020              Requested by: Eunice Blase, MD [address], [place],  [place] [address] PCP: Eunice Blase, MD   Assessment & Plan: Visit Diagnoses:  1. Morbid obesity ([place])   2. Unilateral primary osteoarthritis, right hip   3. Pain in right hip     Plan: I had a long and thorough discussion about the heightened risks of wound and soft tissue issues infection given her obesity.  I would like her to lose a little bit more weight before proceeding with scheduling surgery due to a higher complication rate in patients with BMI is over 40.  I talked to her about my complication rate in patients with her body habitus as well.  I gave her handout about hip replacement surgery.  I would like to see her back in 2 months with a repeat weight and BMI calculation.  She understands this fully as well.  All questions and concerns were answered and addressed.  Follow-Up Instructions: Return in about 2 months (around 03/01/2021).   Orders:  No orders of the defined types were placed in this encounter.  No orders of the defined types were placed in this encounter.     Procedures: No procedures performed   Clinical Data: No additional findings.   Subjective: No chief complaint on file. The patient is a very pleasant 65 year old female that I have seen way in the past but not recent.  She has severe end-stage arthritis of her right hip is more well-documented.  She ambulates with a walker.  Her pain is severe and it is 10 out of 10 and daily.  It is definitely affecting her mobility, her quality of life and actives daily living.  This has been getting worse for over a year now.  She has had multiple steroid injections and that has not helped at this point.  She used to weigh around 387 pounds.  Today her weight is 245.8 pounds with a BMI of 42.17.  She is  lost a significant amount of weight.  She is interested in hip replacement surgery.  She is not diabetic.  She has significant limitations in her mobility due to the severe arthritis of her right hip.  HPI  Review of Systems There is currently listed no headache, chest pain, shortness of breath, fever, chills, nausea, vomiting  Objective: Vital Signs: Ht '5\' 4"'$  (1.626 m)   Wt 245 lb 12.8 oz (111.5 kg)   BMI 42.19 kg/m   Physical Exam She is alert and orient x3 and in no acute distress Ortho Exam Examination of her right hip shows severe pain with any attempts of rotation.  I had her lay in a supine position.  She does have a large soft tissue envelope around her proximal thigh. Specialty Comments:  No specialty comments available.  Imaging: No results found. Previous x-rays show severe right hip osteoarthritis with complete loss of joint space and significant sclerotic changes and cystic changes in the femoral head and acetabulum.  There is flattening of the femoral head.  PMFS History: Patient Active Problem List   Diagnosis Date Noted   Vitamin D deficiency 07/03/2020   Primary localized osteoarthritis of pelvic region and thigh 07/03/2020   Osteoarthritis  of knee 07/03/2020   Morbid obesity ([place]) 07/03/2020   Enthesopathy of hip region 07/03/2020   Depression 07/03/2020   Chronic pain 07/03/2020   Anxiety 07/03/2020   Unilateral primary osteoarthritis, right hip 12/21/2018   Varicose veins of right lower extremity with complications 99991111   Acute bronchitis 12/28/2011   Hypoxia 12/27/2011   Dyspnea 12/27/2011   Tachycardia 12/27/2011   Past Medical History:  Diagnosis Date   Breast cancer ([facility])    radiation   DVT (deep venous thrombosis) ([facility])    Hypertension    Pneumonia    Superficial thrombophlebitis of right leg    Venous insufficiency of right leg     Family History  Problem Relation Age of Onset   Diabetes Mother    Cancer Mother    Hypertension  Other    Coronary artery disease Other     Past Surgical History:  Procedure Laterality Date   APPENDECTOMY     Left lumpectomy     MANDIBLE FRACTURE SURGERY     Social History   Occupational History   Not on file  Tobacco Use   Smoking status: Former    Types: Cigarettes    Quit date: 05/17/1998    Years since quitting: 22.6   Smokeless tobacco: Former  Scientific laboratory technician Use: Never used  Substance and Sexual Activity   Alcohol use: Yes    Comment: Occasional Wine   Drug use: No   Sexual activity: Never

## 2021-01-06 ENCOUNTER — Other Ambulatory Visit: Payer: Self-pay

## 2021-01-06 DIAGNOSIS — R1084 Generalized abdominal pain: Secondary | ICD-10-CM

## 2021-01-13 ENCOUNTER — Other Ambulatory Visit: Payer: BC Managed Care – PPO

## 2021-02-17 ENCOUNTER — Encounter: Payer: Self-pay | Admitting: *Deleted

## 2021-03-16 ENCOUNTER — Ambulatory Visit: Payer: Self-pay | Admitting: Surgery

## 2021-03-16 NOTE — H&P (Signed)
History of Present Illness: Virginia Morgan is a 65 y.o. female who was referred to me for evaluation of an umbilical hernia. She has had the hernia for many years and at first it did not bother her. However in the last 6 months she has started having periumbilical pain with radiation across the abdomen, and reports that the hernia seems to have enlarged during that time. The pain has significantly worsened and has been very severe in the last 3 weeks. The area over the hernia is very sensitive to touch and makes it difficult to wear certain clothing. She says it gets worse after she eats and is associated with diarrhea. She has not been able to identify certain foods that cause the pain. She does report that the hernia sometimes becomes firm and red.    She has lost over 100 pounds in the last year with dietary changes, and is working on further weight loss so that she can undergo a right hip replacement. She says her abdominal pain has significantly limited her quality of life as it occurs daily.   Her only prior abdominal surgery is a laparoscopic appendectomy in 2001 by Dr. Deon Pilling.      Review of Systems: A complete review of systems was obtained from the patient.  I have reviewed this information and discussed as appropriate with the patient.  See HPI as well for other ROS.       Medical History: Past Medical History Past Medical History: Diagnosis Date  Arthritis    DVT (deep venous thrombosis) (CMS-HCC)    History of cancer    Hypertension        There is no problem list on file for this patient.     Past Surgical History Past Surgical History: Procedure Laterality Date  APPENDECTOMY      MASTECTOMY PARTIAL / LUMPECTOMY          Allergies Allergies Allergen Reactions  Penicillins Other (See Comments)     Swelling, Whelps  Sulfa (Sulfonamide Antibiotics) Hives     Possible hives,  unsure      Current Outpatient Medications on File Prior to  Visit Medication Sig Dispense Refill  BELBUCA 450 mcg Film PLACE 1 FILM INSIDE CHEEK TWICE DAILY      FLUoxetine (PROZAC) 20 MG capsule TAKE 1 CAPSULE BY MOUTH EVERY DAY AFTER BREAKFAST      oxyCODONE-acetaminophen (PERCOCET) 10-325 mg tablet Take 1 tablet by mouth 3 (three) times daily as needed       No current facility-administered medications on file prior to visit.     Family History Family History Problem Relation Age of Onset  High blood pressure (Hypertension) Mother    Hyperlipidemia (Elevated cholesterol) Mother    Diabetes Mother    Breast cancer Mother    High blood pressure (Hypertension) Father    Hyperlipidemia (Elevated cholesterol) Father    Obesity Brother    High blood pressure (Hypertension) Brother        Social History   Tobacco Use Smoking Status Former  Types: Cigarettes  Quit date: 2011  Years since quitting: 11.8 Smokeless Tobacco Never     Social History Social History    Socioeconomic History  Marital status: Divorced Tobacco Use  Smoking status: Former     Types: Cigarettes     Quit date: 2011     Years since quitting: 11.8  Smokeless tobacco: Never Vaping Use  Vaping Use: Never used Substance and Sexual Activity  Alcohol use: Not Currently  Drug use: Not Currently  Sexual activity: Defer      Objective:     Vitals:   03/16/21 1015 BP: 132/76 Pulse: (!) 123 Temp: 37 C (98.6 F) SpO2: 98% Weight: (!) 111.2 kg (245 lb 3.2 oz) Height: 162.6 cm (5\' 4" )   Body mass index is 42.09 kg/m.   Physical Exam Vitals reviewed.  Constitutional:      Appearance: Normal appearance.  HENT:     Head: Normocephalic and atraumatic.  Eyes:     General: No scleral icterus.    Conjunctiva/sclera: Conjunctivae normal.  Cardiovascular:     Rate and Rhythm: Normal rate and regular rhythm.     Heart sounds: No murmur heard. Pulmonary:     Effort: Pulmonary effort is normal. No respiratory distress.     Breath sounds: Normal breath  sounds. No stridor.  Abdominal:     General: There is no distension.     Palpations: Abdomen is soft.     Comments: Umbilical hernia, nonreducible in the supine position, sac and contents are approximately 4cm in diameter. No overlying skin changes but the hernia is tender to palpation.  Musculoskeletal:     Cervical back: Normal range of motion.  Neurological:     General: No focal deficit present.     Mental Status: She is alert and oriented to person, place, and time.  Psychiatric:        Mood and Affect: Mood normal.        Behavior: Behavior normal.        Thought Content: Thought content normal.            Assessment and Plan: Diagnoses and all orders for this visit:   Umbilical hernia without obstruction or gangrene       This is a 65 yo female with a symptomatic umbilical hernia. It is nonreducible and I suspect contains omental fat. I discussed with her that I would ideally prefer that her BMI is 35 or less prior to repair as at her current weight she is at very high risk of recurrence. However since her symptoms seem to be very severe and frequent with impact on her daily life, I will proceed with repair. She has already lost a significant amount of weight over the last year and is very motivated to continue so that she can get hip replacement surgery. I discussed that continued weight loss will be important to minimize her risk of hernia recurrence. I discussed the details of an open umbilical hernia repair with possible mesh placement and she agrees to proceed. I also reviewed the postoperative activity restrictions. She will be contacted to schedule an elective surgery date.  Michaelle Birks, [facility], Hepatobiliary and Pancreatic Surgery 03/16/21 2:52 PM

## 2021-03-16 NOTE — H&P (View-Only) (Signed)
History of Present Illness: Virginia Morgan is a 65 y.o. female who was referred to me for evaluation of an umbilical hernia. She has had the hernia for many years and at first it did not bother her. However in the last 6 months she has started having periumbilical pain with radiation across the abdomen, and reports that the hernia seems to have enlarged during that time. The pain has significantly worsened and has been very severe in the last 3 weeks. The area over the hernia is very sensitive to touch and makes it difficult to wear certain clothing. She says it gets worse after she eats and is associated with diarrhea. She has not been able to identify certain foods that cause the pain. She does report that the hernia sometimes becomes firm and red.    She has lost over 100 pounds in the last year with dietary changes, and is working on further weight loss so that she can undergo a right hip replacement. She says her abdominal pain has significantly limited her quality of life as it occurs daily.   Her only prior abdominal surgery is a laparoscopic appendectomy in 2001 by Dr. Deon Pilling.      Review of Systems: A complete review of systems was obtained from the patient.  I have reviewed this information and discussed as appropriate with the patient.  See HPI as well for other ROS.       Medical History: Past Medical History Past Medical History: Diagnosis Date  Arthritis    DVT (deep venous thrombosis) (CMS-HCC)    History of cancer    Hypertension        There is no problem list on file for this patient.     Past Surgical History Past Surgical History: Procedure Laterality Date  APPENDECTOMY      MASTECTOMY PARTIAL / LUMPECTOMY          Allergies Allergies Allergen Reactions  Penicillins Other (See Comments)     Swelling, Whelps  Sulfa (Sulfonamide Antibiotics) Hives     Possible hives,  unsure      Current Outpatient Medications on File Prior to  Visit Medication Sig Dispense Refill  BELBUCA 450 mcg Film PLACE 1 FILM INSIDE CHEEK TWICE DAILY      FLUoxetine (PROZAC) 20 MG capsule TAKE 1 CAPSULE BY MOUTH EVERY DAY AFTER BREAKFAST      oxyCODONE-acetaminophen (PERCOCET) 10-325 mg tablet Take 1 tablet by mouth 3 (three) times daily as needed       No current facility-administered medications on file prior to visit.     Family History Family History Problem Relation Age of Onset  High blood pressure (Hypertension) Mother    Hyperlipidemia (Elevated cholesterol) Mother    Diabetes Mother    Breast cancer Mother    High blood pressure (Hypertension) Father    Hyperlipidemia (Elevated cholesterol) Father    Obesity Brother    High blood pressure (Hypertension) Brother        Social History   Tobacco Use Smoking Status Former  Types: Cigarettes  Quit date: 2011  Years since quitting: 11.8 Smokeless Tobacco Never     Social History Social History    Socioeconomic History  Marital status: Divorced Tobacco Use  Smoking status: Former     Types: Cigarettes     Quit date: 2011     Years since quitting: 11.8  Smokeless tobacco: Never Vaping Use  Vaping Use: Never used Substance and Sexual Activity  Alcohol use: Not Currently  Drug use: Not Currently  Sexual activity: Defer      Objective:     Vitals:   03/16/21 1015 BP: 132/76 Pulse: (!) 123 Temp: 37 C (98.6 F) SpO2: 98% Weight: (!) 111.2 kg (245 lb 3.2 oz) Height: 162.6 cm (5\' 4" )   Body mass index is 42.09 kg/m.   Physical Exam Vitals reviewed.  Constitutional:      Appearance: Normal appearance.  HENT:     Head: Normocephalic and atraumatic.  Eyes:     General: No scleral icterus.    Conjunctiva/sclera: Conjunctivae normal.  Cardiovascular:     Rate and Rhythm: Normal rate and regular rhythm.     Heart sounds: No murmur heard. Pulmonary:     Effort: Pulmonary effort is normal. No respiratory distress.     Breath sounds: Normal breath  sounds. No stridor.  Abdominal:     General: There is no distension.     Palpations: Abdomen is soft.     Comments: Umbilical hernia, nonreducible in the supine position, sac and contents are approximately 4cm in diameter. No overlying skin changes but the hernia is tender to palpation.  Musculoskeletal:     Cervical back: Normal range of motion.  Neurological:     General: No focal deficit present.     Mental Status: She is alert and oriented to person, place, and time.  Psychiatric:        Mood and Affect: Mood normal.        Behavior: Behavior normal.        Thought Content: Thought content normal.            Assessment and Plan: Diagnoses and all orders for this visit:   Umbilical hernia without obstruction or gangrene       This is a 65 yo female with a symptomatic umbilical hernia. It is nonreducible and I suspect contains omental fat. I discussed with her that I would ideally prefer that her BMI is 35 or less prior to repair as at her current weight she is at very high risk of recurrence. However since her symptoms seem to be very severe and frequent with impact on her daily life, I will proceed with repair. She has already lost a significant amount of weight over the last year and is very motivated to continue so that she can get hip replacement surgery. I discussed that continued weight loss will be important to minimize her risk of hernia recurrence. I discussed the details of an open umbilical hernia repair with possible mesh placement and she agrees to proceed. I also reviewed the postoperative activity restrictions. She will be contacted to schedule an elective surgery date.  Michaelle Birks, [facility], Hepatobiliary and Pancreatic Surgery 03/16/21 2:52 PM

## 2021-04-06 NOTE — Pre-Procedure Instructions (Signed)
Surgical Instructions    Your procedure is scheduled on Thursday 04/15/21.   Report to [facility] Main Entrance "A" at 05:30 A.M., then check in with the Admitting office.  Call this number if you have problems the morning of surgery:  [phone]   If you have any questions prior to your surgery date call [phone]: Open Monday-Friday 8am-4pm    Remember:  Do not eat or drink after midnight the night before your surgery     Take these medicines the morning of surgery with A SIP OF WATER   FLUoxetine (PROZAC)   acetaminophen (TYLENOL)- If needed  oxyCODONE-acetaminophen (PERCOCET)- If needed   As of today, STOP taking any Aspirin (unless otherwise instructed by your surgeon) Aleve, Naproxen, Ibuprofen, Motrin, Advil, Goody's, BC's, all herbal medications, fish oil, and all vitamins.  Do not take Semaglutide(Ozempic) the morning of surgery.    After your COVID test   You are not required to quarantine however you are required to wear a well-fitting mask when you are out and around people not in your household.  If your mask becomes wet or soiled, replace with a new one.  Wash your hands often with soap and water for 20 seconds or clean your hands with an alcohol-based hand sanitizer that contains at least 60% alcohol.  Do not share personal items.  Notify your provider: if you are in close contact with someone who has COVID  or if you develop a fever of 100.4 or greater, sneezing, cough, sore throat, shortness of breath or body aches.             Do not wear jewelry or makeup Do not wear lotions, powders, perfumes/colognes, or deodorant. Do not shave 48 hours prior to surgery.  Men may shave face and neck. Do not bring valuables to the hospital. DO Not wear nail polish, gel polish, artificial nails, or any other type of covering on natural nails including finger and toenails. If patients have artificial nails, gel coating, etc. that need to be removed by a nail salon,  please have this removed prior to surgery or surgery may need to be canceled/delayed if the surgeon/ anesthesia feels like the patient is unable to be adequately monitored.             [facility] is not responsible for any belongings or valuables.  Do NOT Smoke (Tobacco/Vaping)  24 hours prior to your procedure  If you use a CPAP at night, you may bring your mask for your overnight stay.   Contacts, glasses, hearing aids, dentures or partials may not be worn into surgery, please bring cases for these belongings   For patients admitted to the hospital, discharge time will be determined by your treatment team.   Patients discharged the day of surgery will not be allowed to drive home, and someone needs to stay with them for 24 hours.  NO VISITORS WILL BE ALLOWED IN PRE-OP WHERE PATIENTS ARE PREPPED FOR SURGERY.  ONLY 1 SUPPORT PERSON MAY BE PRESENT IN THE WAITING ROOM WHILE YOU ARE IN SURGERY.  IF YOU ARE TO BE ADMITTED, ONCE YOU ARE IN YOUR ROOM YOU WILL BE ALLOWED TWO (2) VISITORS. 1 (ONE) VISITOR MAY STAY OVERNIGHT BUT MUST ARRIVE TO THE ROOM BY 8pm.  Minor children may have two parents present. Special consideration for safety and communication needs will be reviewed on a case by case basis.  Special instructions:    Oral Hygiene is also important to reduce your risk  of infection.  Remember - BRUSH YOUR TEETH THE MORNING OF SURGERY WITH YOUR REGULAR TOOTHPASTE   [facility]- Preparing For Surgery  Before surgery, you can play an important role. Because skin is not sterile, your skin needs to be as free of germs as possible. You can reduce the number of germs on your skin by washing with CHG (chlorahexidine gluconate) Soap before surgery.  CHG is an antiseptic cleaner which kills germs and bonds with the skin to continue killing germs even after washing.     Please do not use if you have an allergy to CHG or antibacterial soaps. If your skin becomes reddened/irritated stop using the CHG.   Do not shave (including legs and underarms) for at least 48 hours prior to first CHG shower. It is OK to shave your face.  Please follow these instructions carefully.     Shower the NIGHT BEFORE SURGERY and the MORNING OF SURGERY with CHG Soap.   If you chose to wash your hair, wash your hair first as usual with your normal shampoo. After you shampoo, rinse your hair and body thoroughly to remove the shampoo.  Then [employer] and genitals (private parts) with your normal soap and rinse thoroughly to remove soap.  After that Use CHG Soap as you would any other liquid soap. You can apply CHG directly to the skin and wash gently with a scrungie or a clean washcloth.   Apply the CHG Soap to your body ONLY FROM THE NECK DOWN.  Do not use on open wounds or open sores. Avoid contact with your eyes, ears, mouth and genitals (private parts). Wash Face and genitals (private parts)  with your normal soap.   Wash thoroughly, paying special attention to the area where your surgery will be performed.  Thoroughly rinse your body with warm water from the neck down.  DO NOT shower/wash with your normal soap after using and rinsing off the CHG Soap.  Pat yourself dry with a CLEAN TOWEL.  Wear CLEAN PAJAMAS to bed the night before surgery  Place CLEAN SHEETS on your bed the night before your surgery  DO NOT SLEEP WITH PETS.   Day of Surgery:  Take a shower with CHG soap. Wear Clean/Comfortable clothing the morning of surgery Do not apply any deodorants/lotions.   Remember to brush your teeth WITH YOUR REGULAR TOOTHPASTE.   Please read over the following fact sheets that you were given.

## 2021-04-07 ENCOUNTER — Encounter (HOSPITAL_COMMUNITY): Payer: Self-pay

## 2021-04-07 ENCOUNTER — Other Ambulatory Visit: Payer: Self-pay

## 2021-04-07 ENCOUNTER — Encounter (HOSPITAL_COMMUNITY)
Admission: RE | Admit: 2021-04-07 | Discharge: 2021-04-07 | Disposition: A | Discharge status: Home / Self Care | Payer: BC Managed Care – PPO | Source: Ambulatory Visit | Attending: Surgery | Admitting: Surgery

## 2021-04-07 VITALS — BP 144/71 | HR 71 | Temp 98.2°F | Resp 19 | Ht 64.0 in | Wt 241.0 lb

## 2021-04-07 DIAGNOSIS — I2510 Atherosclerotic heart disease of native coronary artery without angina pectoris: Secondary | ICD-10-CM | POA: Insufficient documentation

## 2021-04-07 DIAGNOSIS — Z87891 Personal history of nicotine dependence: Secondary | ICD-10-CM | POA: Diagnosis not present

## 2021-04-07 DIAGNOSIS — Z86718 Personal history of other venous thrombosis and embolism: Secondary | ICD-10-CM | POA: Diagnosis not present

## 2021-04-07 DIAGNOSIS — Z6841 Body Mass Index (BMI) 40.0 and over, adult: Secondary | ICD-10-CM | POA: Diagnosis not present

## 2021-04-07 DIAGNOSIS — Z8616 Personal history of COVID-19: Secondary | ICD-10-CM | POA: Diagnosis not present

## 2021-04-07 DIAGNOSIS — Z01818 Encounter for other preprocedural examination: Secondary | ICD-10-CM | POA: Insufficient documentation

## 2021-04-07 DIAGNOSIS — I10 Essential (primary) hypertension: Secondary | ICD-10-CM | POA: Diagnosis not present

## 2021-04-07 DIAGNOSIS — K429 Umbilical hernia without obstruction or gangrene: Secondary | ICD-10-CM | POA: Diagnosis not present

## 2021-04-07 DIAGNOSIS — E669 Obesity, unspecified: Secondary | ICD-10-CM | POA: Insufficient documentation

## 2021-04-07 HISTORY — DX: Unspecified osteoarthritis, unspecified site: M19.90

## 2021-04-07 LAB — CBC
HCT: 40.3 % (ref 36.0–46.0)
Hemoglobin: 12.7 g/dL (ref 12.0–15.0)
MCH: 30.6 pg (ref 26.0–34.0)
MCHC: 31.5 g/dL (ref 30.0–36.0)
MCV: 97.1 fL (ref 80.0–100.0)
Platelets: 181 10*3/uL (ref 150–400)
RBC: 4.15 MIL/uL (ref 3.87–5.11)
RDW: 13.4 % (ref 11.5–15.5)
WBC: 5.1 10*3/uL (ref 4.0–10.5)
nRBC: 0 % (ref 0.0–0.2)

## 2021-04-07 LAB — BASIC METABOLIC PANEL
Anion gap: 6 (ref 5–15)
BUN: 17 mg/dL (ref 8–23)
CO2: 26 mmol/L (ref 22–32)
Calcium: 8.9 mg/dL (ref 8.9–10.3)
Chloride: 105 mmol/L (ref 98–111)
Creatinine, Ser: 0.92 mg/dL (ref 0.44–1.00)
GFR, Estimated: >60 mL/min (ref >60)
Glucose, Bld: 92 mg/dL (ref 70–99)
Potassium: 3.8 mmol/L (ref 3.5–5.1)
Sodium: 137 mmol/L (ref 135–145)

## 2021-04-07 NOTE — Progress Notes (Signed)
PCP - Dr. Eunice Blase Cardiologist - Dr. Jenkins Rouge  PPM/ICD - n/a Device Orders - n/a Rep Notified - n/a  Chest x-ray - n/a EKG - 04/07/21 Stress Test - denies ECHO - 12/14/2018 Cardiac Cath - denies  Sleep Study - denies CPAP - n/a  Fasting Blood Sugar - n/a Checks Blood Sugar _____ times a day- n/a  Blood Thinner Instructions: n/a Aspirin Instructions: n/a  ERAS Protcol - No. NPO PRE-SURGERY Ensure or G2- n/a  COVID TEST- No. Ambulatory Surgery   Anesthesia review: Yes. Cardiac History. EKG review.  Patient denies shortness of breath, fever, cough and chest pain at PAT appointment   All instructions explained to the patient, with a verbal understanding of the material. Patient agrees to go over the instructions while at home for a better understanding. Patient also instructed to self quarantine after being tested for COVID-19. The opportunity to ask questions was provided.

## 2021-04-08 NOTE — Anesthesia Preprocedure Evaluation (Addendum)
Anesthesia Evaluation  Patient identified by MRN, date of birth, ID band Patient awake    Reviewed: Allergy & Precautions, NPO status , Patient's Chart, lab work & pertinent test results  Airway Mallampati: II  TM Distance: >3 FB Neck ROM: Full    Dental  (+) Teeth Intact, Dental Advisory Given,    Pulmonary shortness of breath, former smoker,    breath sounds clear to auscultation       Cardiovascular hypertension, + DVT   Rhythm:Regular  1. The left ventricle has normal systolic function, with an ejection  fraction of 55-60%. The cavity size was normal. Left ventricular diastolic  parameters were normal. No evidence of left ventricular regional wall  motion abnormalities.  2. The right ventricle has normal systolic function. The cavity was  normal. There is no increase in right ventricular wall thickness.  3. Left atrial size was mildly dilated.  4. The aortic valve is tricuspid. Aortic valve regurgitation is trivial  by color flow Doppler. Mild aortic annular calcification noted.  5. The mitral valve is grossly normal. Mild thickening of the mitral  valve leaflet. There is mild mitral annular calcification present.  6. The tricuspid valve is grossly normal.  7. The aorta is normal in size and structure.    Neuro/Psych PSYCHIATRIC DISORDERS Anxiety Depression negative neurological ROS     GI/Hepatic Neg liver ROS,   Endo/Other  Morbid obesity  Renal/GU negative Renal ROS     Musculoskeletal  (+) Arthritis ,   Abdominal   Peds  Hematology negative hematology ROS (+) Lab Results      Component                Value               Date                      WBC                      5.1                 04/07/2021                HGB                      12.7                04/07/2021                HCT                      40.3                04/07/2021                MCV                      97.1                 04/07/2021                PLT                      181                 04/07/2021              Anesthesia  Other Findings   Reproductive/Obstetrics                            Anesthesia Physical Anesthesia Plan  ASA: 3  Anesthesia Plan: General   Post-op Pain Management: Gabapentin PO (pre-op) and Tylenol PO (pre-op)   Induction: Intravenous  PONV Risk Score and Plan: 3 and Ondansetron and Dexamethasone  Airway Management Planned: Oral ETT  Additional Equipment: None  Intra-op Plan:   Post-operative Plan: Extubation in OR  Informed Consent: I have reviewed the patients History and Physical, chart, labs and discussed the procedure including the risks, benefits and alternatives for the proposed anesthesia with the patient or authorized representative who has indicated his/her understanding and acceptance.     Dental advisory given  Plan Discussed with: CRNA and Anesthesiologist  Anesthesia Plan Comments: (PAT note written 04/08/2021 by Myra Gianotti, PA-C. )       Anesthesia Quick Evaluation

## 2021-04-08 NOTE — Progress Notes (Signed)
Anesthesia Chart Review:  Case: [id number] Date/Time: 04/15/21 0715   Procedure: OPEN UMBILICAL HERNIA REPAIR WITH MESH   Anesthesia type: General   Pre-op diagnosis: UMBILICAL HERNIA   Location: [facility] ROOM 07 / [facility]   Surgeons: [name], MD       DISCUSSION: Patient is a 65 year old female scheduled for the above procedure. She has a long history of known umbilical hernia, but over the past six months had developed pain and tenderness over the hernia site and worse after eating.   Other history includes former smoker (quit 05/17/88), HTN, breast cancer (left breast lumpectomy for DCIS 06/21/02, radiation), DVT (RLE 08/04/16, s/p Xarelto), mandibular fracture surgery, appendectomy (04/26/00), COVID-19 (04/11/19, s/p bamlanivimab). She is also being evaluated by Jean Rosenthal, MD for right hip osteoarthritis and is being considered for right THA with goal preoperative BMI of < 40. Current BMI 41.37.   Has seen cardiology in the past (2020-2021) for chronic dyspnea and LE edema felt related to obesity and prior DVT history. Echo in 2020 showed LVEF 55-60%, normal RVSF, trivial AR, mild MR/TR. Notes indicate she has intentional lost of over 100 lbs in the past year in hopes to have right THA in the near future. Denied chest pain and SOB at PAT RN visit.   Anesthesia team to evaluate on the day of surgery.   VS: BP (!) 144/71   Pulse 71   Temp 36.8 C (Oral)   Resp 19   Ht 5\' 4"  (1.626 m)   Wt 109.3 kg   SpO2 100%   BMI 41.37 kg/m    PROVIDERS: Hilts, Michael, MD is PCP. Established care 07/03/20.   Jenkins Rouge, MD is cardiologist. She was seen on 11/23/18 for dyspnea and edema felt related to obesity and prior DVT history. If echo showed normal LV/RV function then as needed follow-up planned and would defer use of as needed Lasix to vein specialist and/or vascular surgeon she was seeing. However, she had televisit with Ahmed Prima, [place], PA-C on 07/16/19 for worsening edema despite  as needed Lasix, and unable to wear compression stockings. She reported stable baseline dyspnea and denied any associated orthopnea, PND, chest pain or palpitations. Changed to as needed torsemide. 3 month follow-up for edema recheck had been planned, but has been seeing primary care. Reportedly lost over 100 lbs in the past year with dietary changes in hopes to get her weight at an acceptable reading for THA.    LABS: Labs reviewed: Acceptable for surgery. LFTs normal 06/21/19 (Scanned under Labs tab). (all labs ordered are listed, but only abnormal results are displayed)  Labs Reviewed  BASIC METABOLIC PANEL  CBC     EKG: 04/07/21:  Normal sinus rhythm Possible Anterior infarct , age undetermined Abnormal ECG Confirmed by Sherren Mocha [phone]) on 04/07/2021 4:31:42 PM   CV: Echo 12/14/18: IMPRESSIONS   1. The left ventricle has normal systolic function, with an ejection  fraction of 55-60%. The cavity size was normal. Left ventricular diastolic  parameters were normal. No evidence of left ventricular regional wall  motion abnormalities.   2. The right ventricle has normal systolic function. The cavity was  normal. There is no increase in right ventricular wall thickness.   3. Left atrial size was mildly dilated.   4. The aortic valve is tricuspid. Aortic valve regurgitation is trivial  by color flow Doppler. Mild aortic annular calcification noted.   5. The mitral valve is grossly normal. Mild thickening of the  mitral  valve leaflet. There is mild mitral annular calcification present.   6. The tricuspid valve is grossly normal.   7. The aorta is normal in size and structure.    LLE Venous US 11/02/18 ([facility]): IMPRESSION:  No evidence of DVT within the left lower extremity.     RLE Venous US 08/04/16:  "Extensive superficial venous thrombophlebitis involving superficial varicosities in the mid to proximal anteromedial calf extending over the medial knee, and the medial  mid thigh.  There is partially occlusive thrombus present in the femoral vein, femoral profundus, and common femoral vein.  No thrombus noted in the posterior tibial veins." - F/U RLE Venous US 04/27/17: No evidence of deep venous thrombosis.    Past Medical History:  Diagnosis Date   Arthritis    Breast cancer ([place])    radiation 20 years ago   DVT (deep venous thrombosis) ([facility])    Hypertension    Pneumonia    Superficial thrombophlebitis of right leg    Venous insufficiency of right leg     Past Surgical History:  Procedure Laterality Date   APPENDECTOMY     Left lumpectomy     MANDIBLE FRACTURE SURGERY      MEDICATIONS:  acetaminophen (TYLENOL) 500 MG tablet   baclofen (LIORESAL) 10 MG tablet   FLUoxetine (PROZAC) 40 MG capsule   loperamide (IMODIUM A-D) 2 MG tablet   naloxone (NARCAN) nasal spray 4 mg/0.1 mL   naproxen sodium (ALEVE) 220 MG tablet   oxyCODONE-acetaminophen (PERCOCET) 10-325 MG tablet   Semaglutide, 2 MG/DOSE, (OZEMPIC, 2 MG/DOSE,) 8 MG/3ML SOPN   Simethicone (GAS-X PO)   No current facility-administered medications for this encounter.    Myra Gianotti, PA-C Surgical Short Stay/Anesthesiology [facility] Phone [phone] [facility] Phone [phone] 04/08/2021 3:51 PM

## 2021-04-14 MED ORDER — VANCOMYCIN HCL 1500 MG/300ML IV SOLN
1500.0000 mg | INTRAVENOUS | Status: AC
  Administered 2021-04-15: 1500 mg via INTRAVENOUS
  Filled 2021-04-14: qty 300

## 2021-04-15 ENCOUNTER — Other Ambulatory Visit: Payer: Self-pay

## 2021-04-15 ENCOUNTER — Encounter (HOSPITAL_COMMUNITY): Payer: Self-pay | Admitting: Surgery

## 2021-04-15 ENCOUNTER — Ambulatory Visit (HOSPITAL_COMMUNITY): Payer: BC Managed Care – PPO | Admitting: Vascular Surgery

## 2021-04-15 ENCOUNTER — Encounter (HOSPITAL_COMMUNITY)
Admission: RE | Disposition: A | Discharge status: Home / Self Care | Payer: Self-pay | Source: Home / Self Care | Attending: Surgery

## 2021-04-15 ENCOUNTER — Ambulatory Visit (HOSPITAL_COMMUNITY)
Admission: RE | Admit: 2021-04-15 | Discharge: 2021-04-15 | Disposition: A | Discharge status: Home / Self Care | Payer: BC Managed Care – PPO | Attending: Surgery | Admitting: Surgery

## 2021-04-15 ENCOUNTER — Ambulatory Visit (HOSPITAL_COMMUNITY): Payer: BC Managed Care – PPO | Admitting: Anesthesiology

## 2021-04-15 DIAGNOSIS — I700 Atherosclerosis of aorta: Secondary | ICD-10-CM | POA: Diagnosis not present

## 2021-04-15 DIAGNOSIS — K429 Umbilical hernia without obstruction or gangrene: Secondary | ICD-10-CM | POA: Insufficient documentation

## 2021-04-15 DIAGNOSIS — Z86718 Personal history of other venous thrombosis and embolism: Secondary | ICD-10-CM | POA: Diagnosis not present

## 2021-04-15 DIAGNOSIS — Z87891 Personal history of nicotine dependence: Secondary | ICD-10-CM | POA: Insufficient documentation

## 2021-04-15 DIAGNOSIS — I10 Essential (primary) hypertension: Secondary | ICD-10-CM | POA: Insufficient documentation

## 2021-04-15 HISTORY — PX: UMBILICAL HERNIA REPAIR: SHX196

## 2021-04-15 SURGERY — REPAIR, HERNIA, UMBILICAL, ADULT
Anesthesia: General | Site: Abdomen

## 2021-04-15 MED ORDER — KETOROLAC TROMETHAMINE 30 MG/ML IJ SOLN
INTRAMUSCULAR | Status: AC
  Filled 2021-04-15: qty 1

## 2021-04-15 MED ORDER — ESMOLOL HCL 100 MG/10ML IV SOLN
INTRAVENOUS | Status: AC
  Filled 2021-04-15: qty 10

## 2021-04-15 MED ORDER — DEXAMETHASONE SODIUM PHOSPHATE 10 MG/ML IJ SOLN
INTRAMUSCULAR | Status: DC | PRN
  Administered 2021-04-15: 10 mg via INTRAVENOUS

## 2021-04-15 MED ORDER — PHENYLEPHRINE 40 MCG/ML (10ML) SYRINGE FOR IV PUSH (FOR BLOOD PRESSURE SUPPORT)
PREFILLED_SYRINGE | INTRAVENOUS | Status: DC | PRN
  Administered 2021-04-15: 80 ug via INTRAVENOUS
  Administered 2021-04-15: 40 ug via INTRAVENOUS

## 2021-04-15 MED ORDER — CHLORHEXIDINE GLUCONATE 0.12 % MT SOLN
15.0000 mL | Freq: Once | OROMUCOSAL | Status: AC
  Administered 2021-04-15: 15 mL via OROMUCOSAL
  Filled 2021-04-15: qty 15

## 2021-04-15 MED ORDER — LIDOCAINE 2% (20 MG/ML) 5 ML SYRINGE
INTRAMUSCULAR | Status: DC | PRN
  Administered 2021-04-15: 60 mg via INTRAVENOUS

## 2021-04-15 MED ORDER — PROPOFOL 10 MG/ML IV BOLUS
INTRAVENOUS | Status: AC
  Filled 2021-04-15: qty 20

## 2021-04-15 MED ORDER — ACETAMINOPHEN 160 MG/5ML PO SOLN: 1000.0000 mg | Freq: Once | ORAL | Status: DC | PRN

## 2021-04-15 MED ORDER — 0.9 % SODIUM CHLORIDE (POUR BTL) OPTIME
TOPICAL | Status: DC | PRN
  Administered 2021-04-15: 1000 mL

## 2021-04-15 MED ORDER — BUPIVACAINE HCL (PF) 0.25 % IJ SOLN
INTRAMUSCULAR | Status: AC
  Filled 2021-04-15: qty 30

## 2021-04-15 MED ORDER — GABAPENTIN 300 MG PO CAPS
300.0000 mg | ORAL_CAPSULE | ORAL | Status: AC
  Administered 2021-04-15: 300 mg via ORAL
  Filled 2021-04-15: qty 1

## 2021-04-15 MED ORDER — FENTANYL CITRATE (PF) 100 MCG/2ML IJ SOLN
25.0000 ug | INTRAMUSCULAR | Status: DC | PRN
  Administered 2021-04-15 (×2): 50 ug via INTRAVENOUS

## 2021-04-15 MED ORDER — DEXMEDETOMIDINE (PRECEDEX) IN NS 20 MCG/5ML (4 MCG/ML) IV SYRINGE
PREFILLED_SYRINGE | INTRAVENOUS | Status: DC | PRN
  Administered 2021-04-15 (×2): 8 ug via INTRAVENOUS

## 2021-04-15 MED ORDER — ACETAMINOPHEN 500 MG PO TABS: 1000.0000 mg | ORAL_TABLET | Freq: Once | ORAL | Status: DC | PRN

## 2021-04-15 MED ORDER — ORAL CARE MOUTH RINSE: 15.0000 mL | Freq: Once | OROMUCOSAL | Status: AC

## 2021-04-15 MED ORDER — BUPIVACAINE-EPINEPHRINE 0.25% -1:200000 IJ SOLN
INTRAMUSCULAR | Status: DC | PRN
  Administered 2021-04-15: 30 mL

## 2021-04-15 MED ORDER — OXYCODONE HCL 5 MG PO TABS
5.0000 mg | ORAL_TABLET | Freq: Once | ORAL | Status: AC | PRN
  Administered 2021-04-15: 5 mg via ORAL

## 2021-04-15 MED ORDER — SUGAMMADEX SODIUM 200 MG/2ML IV SOLN
INTRAVENOUS | Status: DC | PRN
  Administered 2021-04-15: 400 mg via INTRAVENOUS

## 2021-04-15 MED ORDER — PROPOFOL 10 MG/ML IV BOLUS
INTRAVENOUS | Status: DC | PRN
  Administered 2021-04-15: 30 mg via INTRAVENOUS
  Administered 2021-04-15: 120 mg via INTRAVENOUS

## 2021-04-15 MED ORDER — ROCURONIUM BROMIDE 10 MG/ML (PF) SYRINGE
PREFILLED_SYRINGE | INTRAVENOUS | Status: AC
  Filled 2021-04-15: qty 10

## 2021-04-15 MED ORDER — LIDOCAINE 2% (20 MG/ML) 5 ML SYRINGE
INTRAMUSCULAR | Status: AC
  Filled 2021-04-15: qty 5

## 2021-04-15 MED ORDER — ONDANSETRON HCL 4 MG/2ML IJ SOLN
INTRAMUSCULAR | Status: AC
  Filled 2021-04-15: qty 2

## 2021-04-15 MED ORDER — ONDANSETRON HCL 4 MG/2ML IJ SOLN
INTRAMUSCULAR | Status: DC | PRN
  Administered 2021-04-15: 4 mg via INTRAVENOUS

## 2021-04-15 MED ORDER — ROCURONIUM BROMIDE 10 MG/ML (PF) SYRINGE
PREFILLED_SYRINGE | INTRAVENOUS | Status: DC | PRN
  Administered 2021-04-15: 80 mg via INTRAVENOUS
  Administered 2021-04-15: 40 mg via INTRAVENOUS
  Administered 2021-04-15: 20 mg via INTRAVENOUS

## 2021-04-15 MED ORDER — MIDAZOLAM HCL 2 MG/2ML IJ SOLN
INTRAMUSCULAR | Status: AC
  Filled 2021-04-15: qty 2

## 2021-04-15 MED ORDER — OXYCODONE HCL 5 MG/5ML PO SOLN: 5.0000 mg | Freq: Once | ORAL | Status: AC | PRN

## 2021-04-15 MED ORDER — KETOROLAC TROMETHAMINE 30 MG/ML IJ SOLN
30.0000 mg | Freq: Once | INTRAMUSCULAR | Status: AC
  Administered 2021-04-15: 30 mg via INTRAVENOUS

## 2021-04-15 MED ORDER — FENTANYL CITRATE (PF) 100 MCG/2ML IJ SOLN
INTRAMUSCULAR | Status: AC
  Filled 2021-04-15: qty 2

## 2021-04-15 MED ORDER — OXYCODONE HCL 5 MG PO TABS: 5.0000 mg | ORAL_TABLET | Freq: Four times a day (QID) | ORAL | 0 refills | Status: AC | PRN

## 2021-04-15 MED ORDER — PHENYLEPHRINE HCL-NACL 20-0.9 MG/250ML-% IV SOLN
INTRAVENOUS | Status: AC
  Filled 2021-04-15: qty 750

## 2021-04-15 MED ORDER — ACETAMINOPHEN 10 MG/ML IV SOLN: 1000.0000 mg | Freq: Once | INTRAVENOUS | Status: DC | PRN

## 2021-04-15 MED ORDER — ACETAMINOPHEN 500 MG PO TABS
1000.0000 mg | ORAL_TABLET | ORAL | Status: AC
  Administered 2021-04-15: 1000 mg via ORAL
  Filled 2021-04-15: qty 2

## 2021-04-15 MED ORDER — FENTANYL CITRATE (PF) 250 MCG/5ML IJ SOLN
INTRAMUSCULAR | Status: DC | PRN
  Administered 2021-04-15: 50 ug via INTRAVENOUS
  Administered 2021-04-15: 100 ug via INTRAVENOUS
  Administered 2021-04-15 (×2): 50 ug via INTRAVENOUS

## 2021-04-15 MED ORDER — OXYCODONE HCL 5 MG PO TABS
ORAL_TABLET | ORAL | Status: AC
  Filled 2021-04-15: qty 1

## 2021-04-15 MED ORDER — FENTANYL CITRATE (PF) 250 MCG/5ML IJ SOLN
INTRAMUSCULAR | Status: AC
  Filled 2021-04-15: qty 5

## 2021-04-15 MED ORDER — LACTATED RINGERS IV SOLN: INTRAVENOUS | Status: DC

## 2021-04-15 MED ORDER — PHENYLEPHRINE 40 MCG/ML (10ML) SYRINGE FOR IV PUSH (FOR BLOOD PRESSURE SUPPORT)
PREFILLED_SYRINGE | INTRAVENOUS | Status: AC
  Filled 2021-04-15: qty 10

## 2021-04-15 MED ORDER — DEXAMETHASONE SODIUM PHOSPHATE 10 MG/ML IJ SOLN
INTRAMUSCULAR | Status: AC
  Filled 2021-04-15: qty 1

## 2021-04-15 MED ORDER — MIDAZOLAM HCL 2 MG/2ML IJ SOLN
INTRAMUSCULAR | Status: DC | PRN
  Administered 2021-04-15: 2 mg via INTRAVENOUS

## 2021-04-15 SURGICAL SUPPLY — 34 items
BAG COUNTER SPONGE SURGICOUNT (BAG) ×2 IMPLANT
BENZOIN TINCTURE PRP APPL 2/3 (GAUZE/BANDAGES/DRESSINGS) ×2 IMPLANT
CANISTER SUCT 3000ML PPV (MISCELLANEOUS) ×2 IMPLANT
CHLORAPREP W/TINT 26 (MISCELLANEOUS) ×2 IMPLANT
CLSR STERI-STRIP ANTIMIC 1/2X4 (GAUZE/BANDAGES/DRESSINGS) ×2 IMPLANT
COVER SURGICAL LIGHT HANDLE (MISCELLANEOUS) ×2 IMPLANT
DERMABOND ADVANCED (GAUZE/BANDAGES/DRESSINGS) ×1
DERMABOND ADVANCED .7 DNX12 (GAUZE/BANDAGES/DRESSINGS) ×1 IMPLANT
DRAPE LAPAROTOMY 100X72 PEDS (DRAPES) ×2 IMPLANT
DRSG TEGADERM 4X4.75 (GAUZE/BANDAGES/DRESSINGS) ×2 IMPLANT
ELECT REM PT RETURN 9FT ADLT (ELECTROSURGICAL) ×2
ELECTRODE REM PT RTRN 9FT ADLT (ELECTROSURGICAL) ×1 IMPLANT
GAUZE SPONGE 2X2 8PLY NS (GAUZE/BANDAGES/DRESSINGS) ×2 IMPLANT
GLOVE SURG POLY MICRO LF SZ5.5 (GLOVE) ×2 IMPLANT
GLOVE SURG UNDER POLY LF SZ6 (GLOVE) ×2 IMPLANT
GOWN STRL REUS W/ TWL LRG LVL3 (GOWN DISPOSABLE) ×2 IMPLANT
GOWN STRL REUS W/TWL LRG LVL3 (GOWN DISPOSABLE) ×4
KIT BASIN OR (CUSTOM PROCEDURE TRAY) ×2 IMPLANT
KIT TURNOVER KIT B (KITS) ×2 IMPLANT
NEEDLE HYPO 25GX1X1/2 BEV (NEEDLE) ×2 IMPLANT
NS IRRIG 1000ML POUR BTL (IV SOLUTION) ×2 IMPLANT
PACK GENERAL/GYN (CUSTOM PROCEDURE TRAY) ×2 IMPLANT
PAD ARMBOARD 7.5X6 YLW CONV (MISCELLANEOUS) ×2 IMPLANT
PENCIL SMOKE EVACUATOR (MISCELLANEOUS) ×2 IMPLANT
SUT MNCRL AB 4-0 PS2 18 (SUTURE) ×2 IMPLANT
SUT NOVA NAB DX-16 0-1 5-0 T12 (SUTURE) IMPLANT
SUT NOVA NAB GS-21 0 18 T12 DT (SUTURE) ×2 IMPLANT
SUT SILK 2 0 (SUTURE) ×2
SUT SILK 2-0 18XBRD TIE 12 (SUTURE) ×1 IMPLANT
SUT VIC AB 3-0 SH 27 (SUTURE) ×2
SUT VIC AB 3-0 SH 27X BRD (SUTURE) ×1 IMPLANT
SYR CONTROL 10ML LL (SYRINGE) ×2 IMPLANT
TOWEL GREEN STERILE (TOWEL DISPOSABLE) ×2 IMPLANT
TOWEL GREEN STERILE FF (TOWEL DISPOSABLE) ×2 IMPLANT

## 2021-04-15 NOTE — Anesthesia Procedure Notes (Signed)
Procedure Name: Intubation Date/Time: 04/15/2021 7:37 AM Performed by: Vonna Drafts, CRNA Pre-anesthesia Checklist: Patient identified, Emergency Drugs available, Suction available and Patient being monitored Patient Re-evaluated:Patient Re-evaluated prior to induction Oxygen Delivery Method: Circle system utilized Preoxygenation: Pre-oxygenation with 100% oxygen Induction Type: IV induction Ventilation: Two handed mask ventilation required Laryngoscope Size: Mac and 3 Grade View: Grade I Tube type: Oral Tube size: 7.0 mm Number of attempts: 1 Airway Equipment and Method: Stylet and Oral airway Placement Confirmation: ETT inserted through vocal cords under direct vision, positive ETCO2 and breath sounds checked- equal and bilateral Secured at: 22 cm Tube secured with: Tape Dental Injury: Teeth and Oropharynx as per pre-operative assessment

## 2021-04-15 NOTE — Interval H&P Note (Signed)
History and Physical Interval Note:  04/15/2021 7:18 AM  Virginia Morgan  has presented today for surgery, with the diagnosis of UMBILICAL HERNIA.  The various methods of treatment have been discussed with the patient and family. After consideration of risks, benefits and other options for treatment, the patient has consented to  Procedure(s): OPEN UMBILICAL HERNIA REPAIR WITH MESH (N/A) as a surgical intervention.  The patient's history has been reviewed, patient examined, no change in status, stable for surgery.  I have reviewed the patient's chart and labs.  Questions were answered to the patient's satisfaction.  Proceed to OR, plan for discharge home from PACU.   [name]

## 2021-04-15 NOTE — Progress Notes (Signed)
Patient has been waiting patiently for daughter to pick attempts made to call daughter to review discharge instructions without success.Marland Kitchen Spoke with daughter at 11 reports she is on her way and be about 25 minutes away

## 2021-04-15 NOTE — Anesthesia Postprocedure Evaluation (Signed)
Anesthesia Post Note  Patient: Virginia Morgan  Procedure(s) Performed: OPEN UMBILICAL HERNIA REPAIR (Abdomen)     Patient location during evaluation: PACU Anesthesia Type: General Level of consciousness: awake and alert Pain management: pain level controlled Vital Signs Assessment: post-procedure vital signs reviewed and stable Respiratory status: spontaneous breathing, nonlabored ventilation, respiratory function stable and patient connected to nasal cannula oxygen Cardiovascular status: stable and blood pressure returned to baseline Postop Assessment: no apparent nausea or vomiting Anesthetic complications: no   No notable events documented.  Last Vitals:  Vitals:   04/15/21 0903 04/15/21 0918  BP: (!) 115/50 115/63  Pulse: (!) 59 (!) 58  Resp: 12 12  Temp:  36.4 C  SpO2: 96% 97%    Last Pain:  Vitals:   04/15/21 0917  TempSrc:   PainSc: 3                  Haeden Hudock

## 2021-04-15 NOTE — Discharge Instructions (Addendum)
[facility] DISCHARGE INSTRUCTIONS  Activity No heavy lifting greater than 15 pounds for 6 weeks after surgery. Ok to shower in 48 hours after surgery, but do not bathe or submerge incisions underwater. Do not drive while taking narcotic pain medication.  Wound Care Your incision is covered with a dressing, which you may remove in 48 hours. It is then ok to shower. The small white strips under the dressing will fall off after about a week. If they have not fallen off after 2 weeks, you may remove them. You may shower and allow warm soapy water to run over your incisions. Gently pat dry. Do not bathe submerge your incision underwater for 4 weeks. Monitor your incision for any new redness, tenderness, or drainage.  When to Call us: Fever greater than 100.5 New redness, drainage, or swelling at incision site Severe pain, nausea, or vomiting  Follow-up You have an appointment scheduled with Dr. Zenia Resides on May 07, 2021 at 10:30am. This will be at the [facility] Surgery office at [address], [place], [place], [place]. Please arrive at least 15 minutes prior to your scheduled appointment time.  For questions or concerns, please call the office at [phone].

## 2021-04-15 NOTE — Transfer of Care (Signed)
Immediate Anesthesia Transfer of Care Note  Patient: Virginia Morgan  Procedure(s) Performed: OPEN UMBILICAL HERNIA REPAIR (Abdomen)  Patient Location: PACU  Anesthesia Type:General  Level of Consciousness: awake and alert   Airway & Oxygen Therapy: Patient Spontanous Breathing  Post-op Assessment: Report given to RN and Post -op Vital signs reviewed and stable  Post vital signs: Reviewed and stable  Last Vitals:  Vitals Value Taken Time  BP 116/61 04/15/21 0848  Temp    Pulse 63 04/15/21 0849  Resp 9 04/15/21 0849  SpO2 98 % 04/15/21 0849  Vitals shown include unvalidated device data.  Last Pain:  Vitals:   04/15/21 0612  TempSrc:   PainSc: 4          Complications: No notable events documented.

## 2021-04-15 NOTE — Op Note (Signed)
Date: 04/15/21  Patient: Virginia Morgan MRN: [id number]  Preoperative Diagnosis: Umbilical hernia Postoperative Diagnosis: Same  Procedure: Open umbilical hernia repair  Surgeon: Michaelle Birks, MD  EBL: Minimal  Anesthesia: General endotracheal  Specimens: None  Indications: Ms. Cupo is a 65 year old female who presented with symptomatic umbilical hernia repair, daily pain at the site of the hernia.  After discussion of the risks and benefits of surgery she agreed to proceed with repair.  Findings: Umbilical hernia with a 1.5 cm fascial defect with a large amount of omentum within the hernia.  Hernia was repaired primarily.  Procedure details: Informed consent was obtained in the preoperative area prior to the procedure. The patient was brought to the operating room and placed on the table in the supine position.  General anesthesia was induced and appropriate lines and drains were placed for intraoperative monitoring. Perioperative antibiotics were administered per SCIP guidelines. The abdomen was prepped and draped in the usual sterile fashion. A pre-procedure timeout was taken verifying patient identity, surgical site and procedure to be performed.  A curvilinear supraumbilical skin incision was made, and the subcutaneous tissue was divided with cautery.  The hernia sac was encountered, and was dissected out of the surrounding subcutaneous tissue using blunt dissection and cautery.  The sac was large and was opened, and contained a large amount of viable omentum.  The sac was taken off of the surrounding fascia using cautery.  The sac was completely excised and discarded.  The omentum was examined and was viable in appearance, and appeared to be herniated through a small fascial defect.  Attempts were made to reduce the omentum back into the abdomen this was not possible.  This the omentum was ligated with 2-0 silk ties and amputated near the fascia.  The remaining omentum was then  reduced back into the abdomen.  The peritoneal side of the fascia was palpated and was free of adhesions.  Subcutaneous tissue was cleared off the fascia circumferentially using cautery.  The fascial defect was measured at 1.5 cm.  The hernia defect was closed primarily using interrupted 0 Novafil sutures.  A total of 5 sutures were placed.  Prior to tying down the sutures, the peritoneal side of the fascia was palpated to ensure that no bowel had been brought up into the sutures.  The sutures were then tied down.  The wound was irrigated and hemostasis was achieved.  The umbilical skin was tacked down to the fascia using interrupted 3-0 Vicryl sutures.  The deep dermal layer was closed with interrupted 3-0 Vicryl sutures.  The skin was closed with a running subcuticular 4-0 Monocryl suture.  Steri-Strips and a sterile dressing were applied.  The patient tolerated the procedure well with no apparent complications.  All counts were correct x2 at the end of the procedure. The patient was extubated and taken to PACU in stable condition.  Michaelle Birks, MD 04/15/21 8:52 AM

## 2021-04-16 ENCOUNTER — Encounter (HOSPITAL_COMMUNITY): Payer: Self-pay | Admitting: Surgery

## 2022-03-01 IMAGING — CR DG LUMBAR SPINE 2-3V
4 series · 4 of 4 positions shown · non-contrast
Comparison: None.

CLINICAL DATA: Low back pain, fall

EXAM:
LUMBAR SPINE - 2-3 VIEW

[l-spine ap]
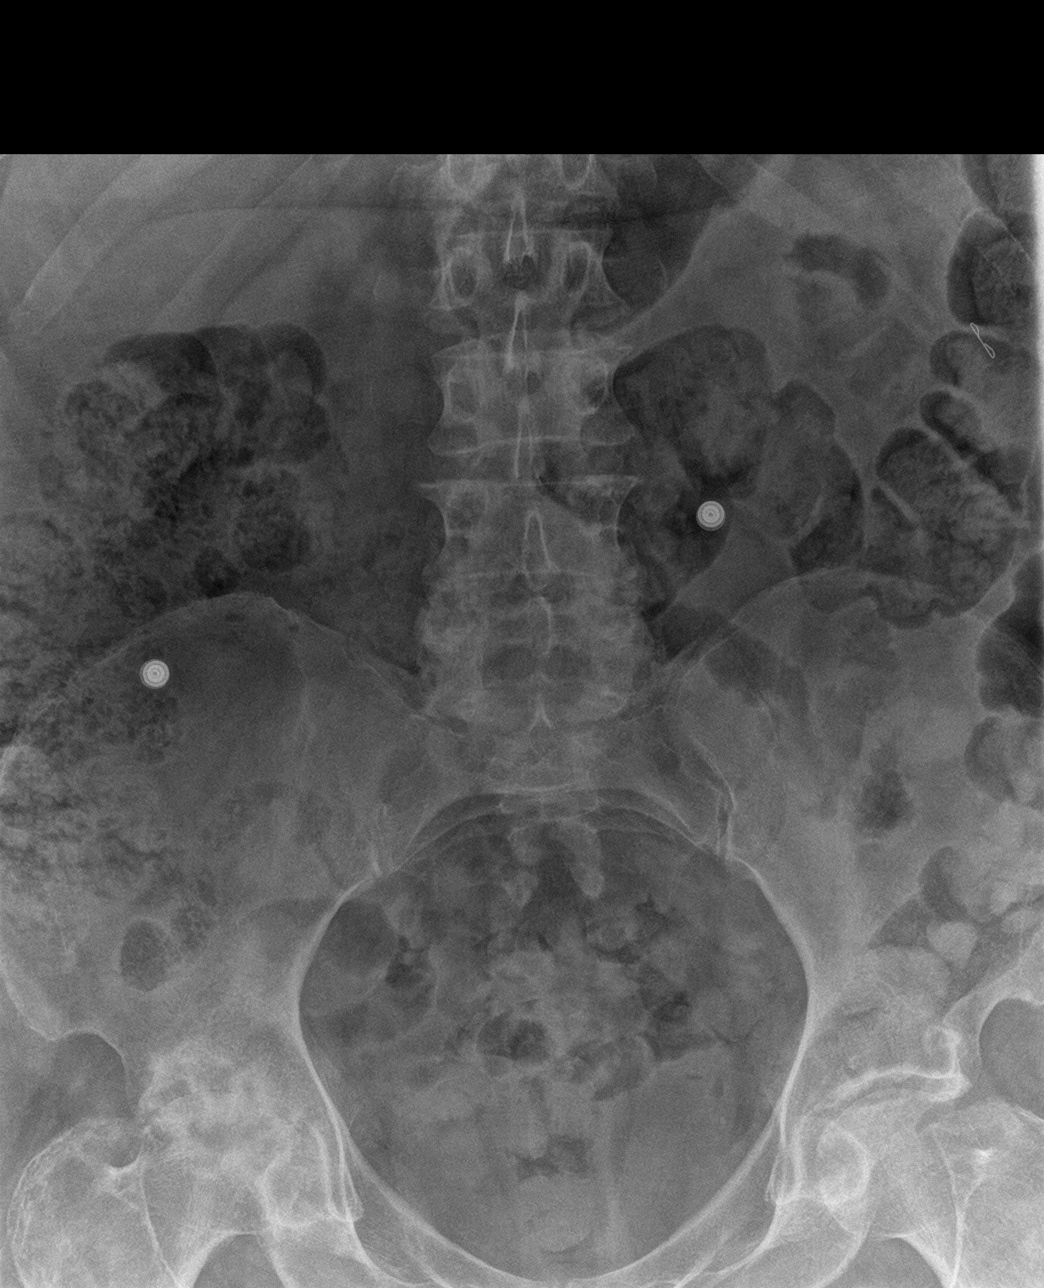

[l-spine lat (1 of 2)]
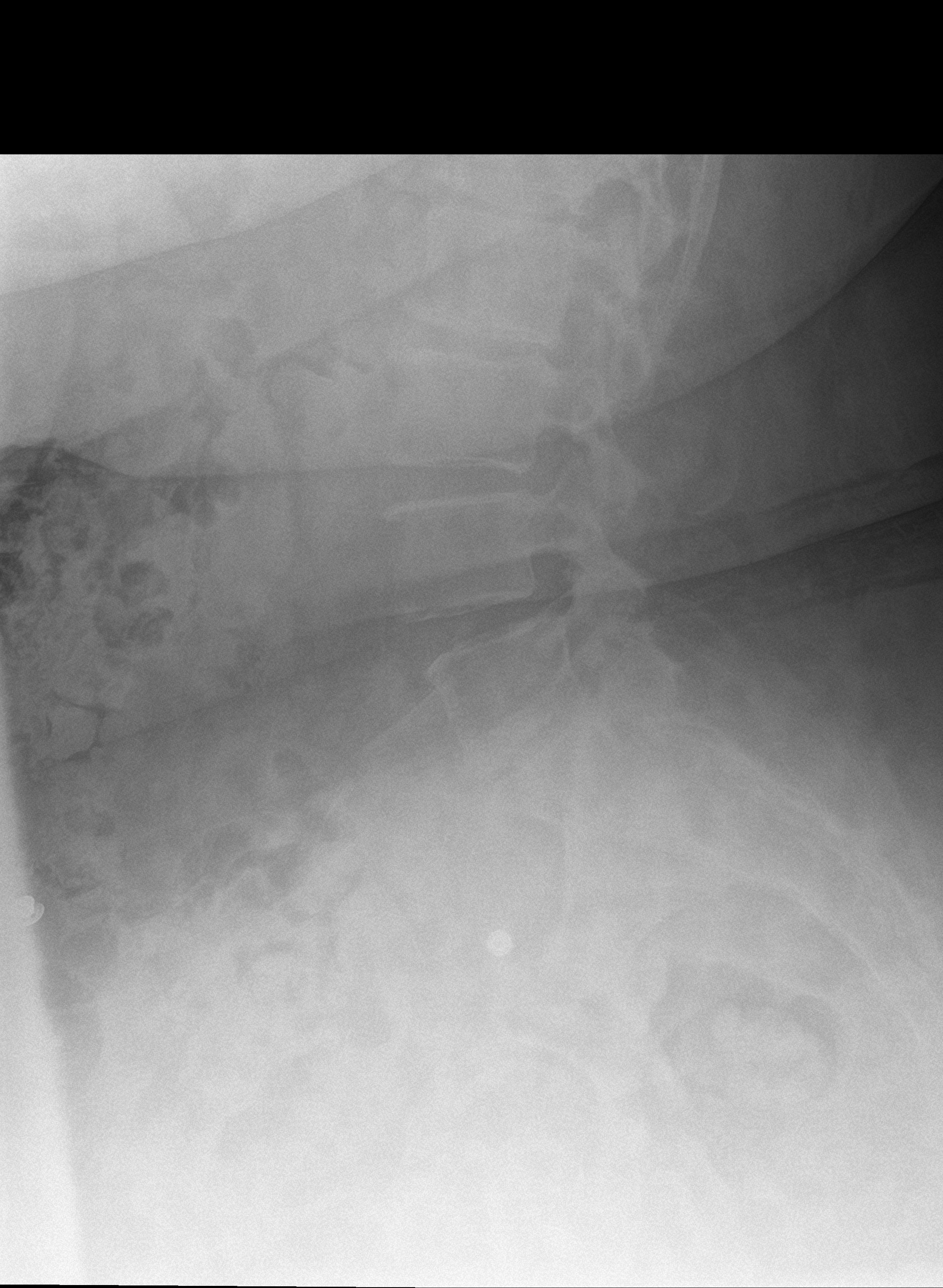

[l-spine lat (2 of 2)]
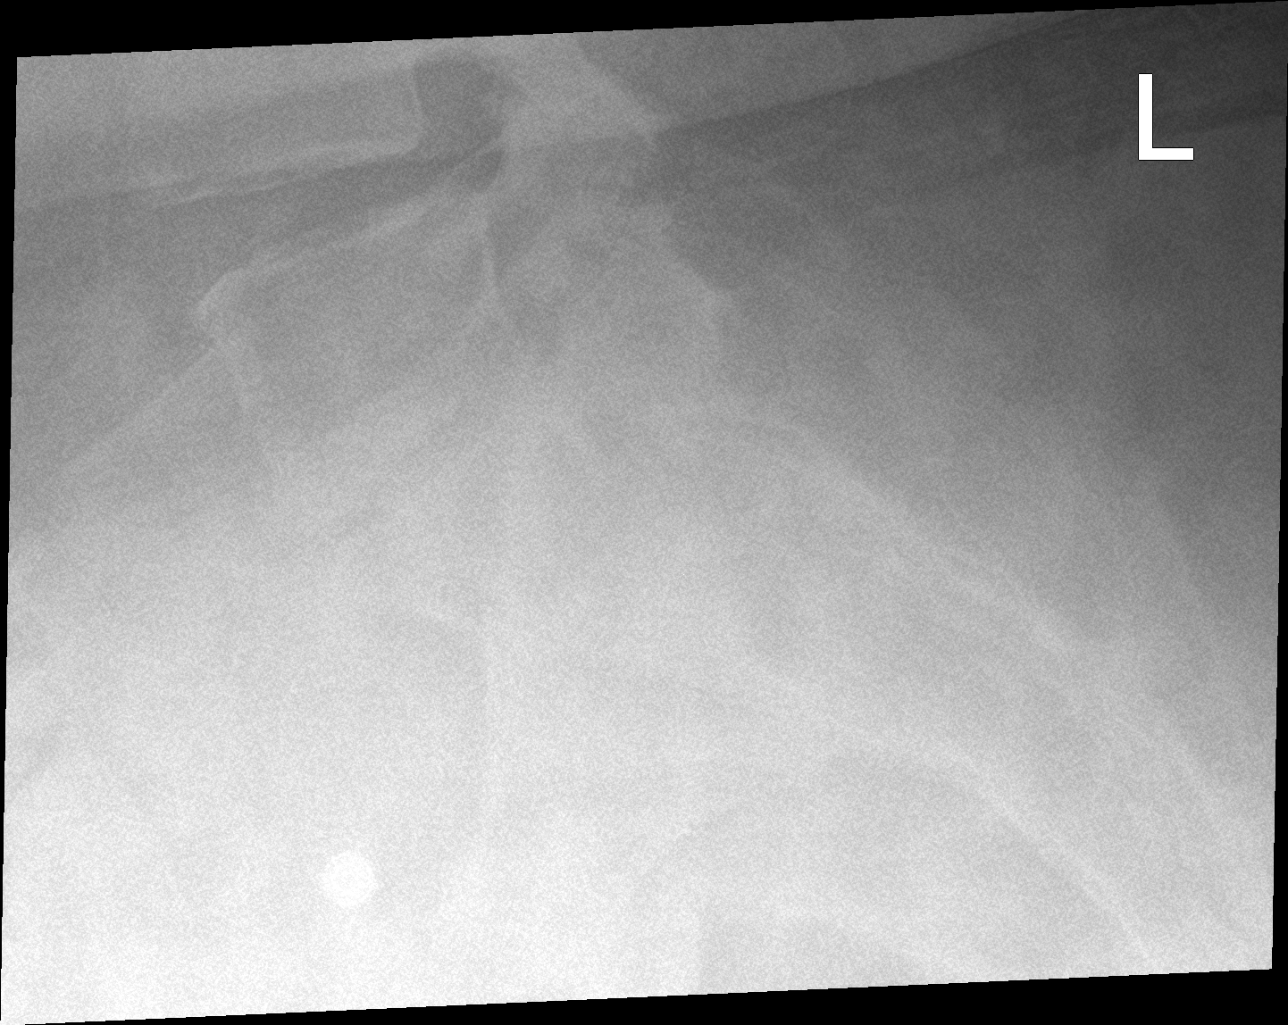

[l-spine spot]
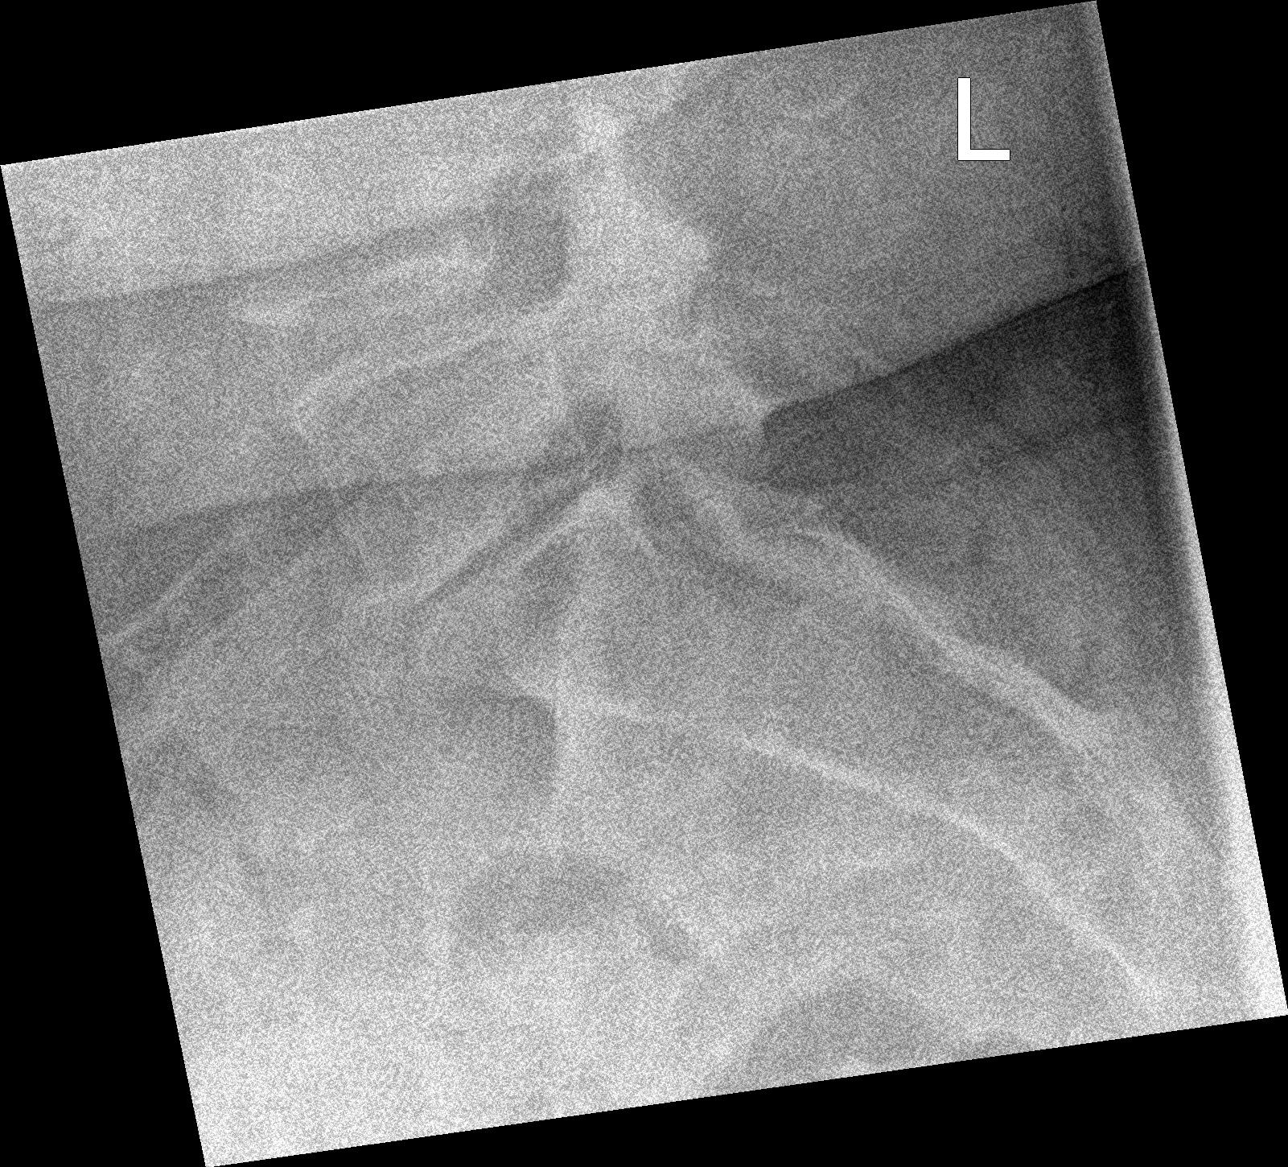

[4 of 4 positions shown; findings below may reference images not displayed]

FINDINGS: Degenerative disc and facet disease in the lumbar spine. Slight
anterolisthesis of L4 on L5. No fracture. SI joints symmetric and
unremarkable. Moderate to severe degenerative changes in the hips,
right greater than left.
IMPRESSION: No acute bony abnormality.

## 2023-03-23 ENCOUNTER — Other Ambulatory Visit: Payer: Self-pay | Admitting: Family Medicine

## 2023-03-23 DIAGNOSIS — Z1231 Encounter for screening mammogram for malignant neoplasm of breast: Secondary | ICD-10-CM

## 2023-04-17 ENCOUNTER — Ambulatory Visit: Payer: BC Managed Care – PPO

## 2023-06-28 ENCOUNTER — Ambulatory Visit (INDEPENDENT_AMBULATORY_CARE_PROVIDER_SITE_OTHER): Payer: PPO

## 2023-06-28 ENCOUNTER — Ambulatory Visit
Admission: EM | Admit: 2023-06-28 | Discharge: 2023-06-28 | Disposition: A | Discharge status: Home / Self Care | Payer: PPO

## 2023-06-28 ENCOUNTER — Other Ambulatory Visit: Payer: Self-pay

## 2023-06-28 ENCOUNTER — Encounter: Payer: Self-pay | Admitting: Emergency Medicine

## 2023-06-28 DIAGNOSIS — R052 Subacute cough: Secondary | ICD-10-CM | POA: Diagnosis not present

## 2023-06-28 DIAGNOSIS — J22 Unspecified acute lower respiratory infection: Secondary | ICD-10-CM | POA: Diagnosis not present

## 2023-06-28 MED ORDER — METHYLPREDNISOLONE ACETATE 40 MG/ML IJ SUSP
40.0000 mg | Freq: Once | INTRAMUSCULAR | Status: AC
  Administered 2023-06-28: 40 mg via INTRAMUSCULAR

## 2023-06-28 MED ORDER — DOXYCYCLINE HYCLATE 100 MG PO CAPS: 100.0000 mg | ORAL_CAPSULE | Freq: Two times a day (BID) | ORAL | 0 refills | Status: AC

## 2023-06-28 MED ORDER — ALBUTEROL SULFATE (2.5 MG/3ML) 0.083% IN NEBU: 2.5000 mg | INHALATION_SOLUTION | Freq: Four times a day (QID) | RESPIRATORY_TRACT | 0 refills | Status: AC | PRN

## 2023-06-28 MED ORDER — BENZONATATE 100 MG PO CAPS
100.0000 mg | ORAL_CAPSULE | Freq: Three times a day (TID) | ORAL | 0 refills | Status: AC | PRN
Start: 2023-06-28 — End: ?

## 2023-06-28 MED ORDER — ALBUTEROL SULFATE (2.5 MG/3ML) 0.083% IN NEBU
2.5000 mg | INHALATION_SOLUTION | Freq: Once | RESPIRATORY_TRACT | Status: AC
  Administered 2023-06-28: 2.5 mg via RESPIRATORY_TRACT

## 2023-06-28 MED ORDER — PREDNISONE 20 MG PO TABS: 40.0000 mg | ORAL_TABLET | Freq: Every day | ORAL | 0 refills | Status: AC

## 2023-06-28 NOTE — ED Triage Notes (Signed)
Pt reports productive cough,runny nose, and "fluid in my chest" intermittent fever x2 weeks. Moderate dyspnea with exertion noted in triage. OTC medications has had minimal effects on symptoms.

## 2023-06-28 NOTE — Discharge Instructions (Addendum)
Chest x-ray today does not show pneumonia.  We are treating you for a lower respiratory infection.  We gave you a breathing treatment and steroid shot today.  Please continue the breathing treatments every 4 hours scheduled at home until Friday, then use them as needed for wheezing, shortness of breath, or cough.  In addition, start taking the oral prednisone tomorrow.  Take the doxycycline as prescribed to treat for lung infection.  Also recommend guaifenesin 600 mg twice daily and Tessalon Perles every 6 hours as needed for coughing.  Make sure you are drinking plenty of fluids.  Recommend follow-up with primary care provider if symptoms do not improve with treatment.  If symptoms worsen, seek care emergently.

## 2023-06-28 NOTE — ED Provider Notes (Signed)
[facility]    CSN: [id number] Arrival date & time: 06/28/23  0911      History   Chief Complaint Chief Complaint  Patient presents with   Cough    HPI Virginia Morgan is a 68 y.o. female.   Patient presents today with 2-week history of congested cough, shortness of breath, chest pain with coughing and with activity, back pain when she takes a deep breath, chest tightness, chest congestion, runny and stuffy nose, decreased appetite, and fatigue.  Patient reports she had a fever 1 week ago, however this is not fully resolved.  Also reports diarrhea a couple of days ago that is now resolved.  No sore throat, headache, ear pain, abdominal pain, nausea, or vomiting.  Reports her grandson was sick with bronchitis 2 weeks ago.  She has been using her grandsons nebulizer which does help with symptoms temporarily.  Has also been taking over-the-counter cough medicine with minimal temporary improvement.  Patient denies history of chronic lung disease.  Denies smoking or vaping history.    Past Medical History:  Diagnosis Date   Arthritis    Breast cancer (HCC)    radiation 20 years ago   DVT (deep venous thrombosis) (HCC)    Hypertension    Pneumonia    Superficial thrombophlebitis of right leg    Venous insufficiency of right leg     Patient Active Problem List   Diagnosis Date Noted   Vitamin D deficiency 07/03/2020   Primary localized osteoarthritis of pelvic region and thigh 07/03/2020   Osteoarthritis of knee 07/03/2020   Morbid obesity (HCC) 07/03/2020   Enthesopathy of hip region 07/03/2020   Depression 07/03/2020   Chronic pain 07/03/2020   Anxiety 07/03/2020   Unilateral primary osteoarthritis, right hip 12/21/2018   Varicose veins of right lower extremity with complications 11/14/2016   Acute bronchitis 12/28/2011   Hypoxia 12/27/2011   Dyspnea 12/27/2011   Tachycardia 12/27/2011    Past Surgical History:  Procedure Laterality Date   APPENDECTOMY      Left lumpectomy     MANDIBLE FRACTURE SURGERY     UMBILICAL HERNIA REPAIR N/A 04/15/2021   Procedure: OPEN UMBILICAL HERNIA REPAIR;  Surgeon: Fritzi Mandes, MD;  Location: [facility] OR;  Service: General;  Laterality: N/A;    OB History   No obstetric history on file.      Home Medications    Prior to Admission medications   Medication Sig Start Date End Date Taking? Authorizing Provider  albuterol (PROVENTIL) (2.5 MG/3ML) 0.083% nebulizer solution Take 3 mLs (2.5 mg total) by nebulization every 6 (six) hours as needed for wheezing or shortness of breath. 06/28/23  Yes Valentino Nose, NP  benzonatate (TESSALON) 100 MG capsule Take 1 capsule (100 mg total) by mouth 3 (three) times daily as needed for cough. Do not take with alcohol or while operating or driving heavy machinery 08/16/79  Yes Cathlean Marseilles A, NP  doxycycline (VIBRAMYCIN) 100 MG capsule Take 1 capsule (100 mg total) by mouth 2 (two) times daily for 7 days. 06/28/23 07/05/23 Yes Valentino Nose, NP  predniSONE (DELTASONE) 20 MG tablet Take 2 tablets (40 mg total) by mouth daily with breakfast for 5 days. 06/28/23 07/03/23 Yes Valentino Nose, NP  acetaminophen (TYLENOL) 500 MG tablet Take 1,000 mg by mouth every 6 (six) hours as needed for headache.    [provider]  ALPRAZolam Prudy Feeler) 0.5 MG tablet Take 0.5 mg by mouth as needed for anxiety.  [provider]  baclofen (LIORESAL) 10 MG tablet Take 1 tablet (10 mg total) by mouth 3 (three) times daily as needed for muscle spasms. Patient taking differently: Take 10 mg by mouth at bedtime. 05/07/19   Hilts, Casimiro Needle, MD  FLUoxetine (PROZAC) 40 MG capsule Take 1 capsule (40 mg total) by mouth daily. 07/03/20   Hilts, Casimiro Needle, MD  loperamide (IMODIUM A-D) 2 MG tablet Take 2 mg by mouth 4 (four) times daily as needed for diarrhea or loose stools.    [provider]  naloxone [facility]) nasal spray 4 mg/0.1 mL Place 1 spray into the nose.     [provider]  naproxen sodium (ALEVE) 220 MG tablet Take 440 mg by mouth 2 (two) times daily as needed (headaches).    [provider]  oxyCODONE (OXY IR/ROXICODONE) 5 MG immediate release tablet Take 1 tablet (5 mg total) by mouth every 6 (six) hours as needed for severe pain. 04/15/21   Fritzi Mandes, MD  oxyCODONE-acetaminophen (PERCOCET) 10-325 MG tablet Take 1 tablet by mouth 3 (three) times daily as needed for pain. 01/16/21   [provider]  Semaglutide, 2 MG/DOSE, (OZEMPIC, 2 MG/DOSE,) 8 MG/3ML SOPN Inject 2 mg into the skin once a week.    [provider]  Simethicone (GAS-X PO) Take 1 tablet by mouth 2 (two) times daily as needed (gas).    [provider]    Family History Family History  Problem Relation Age of Onset   Diabetes Mother    Cancer Mother    Hypertension Other    Coronary artery disease Other     Social History Social History   Tobacco Use   Smoking status: Former    Current packs/day: 0.00    Types: Cigarettes    Quit date: 05/17/1998    Years since quitting: 25.1   Smokeless tobacco: Former  Building services engineer status: Never Used  Substance Use Topics   Alcohol use: Not Currently   Drug use: No     Allergies   Penicillins, Bee venom, and Sulfa antibiotics   Review of Systems Review of Systems Per HPI  Physical Exam Triage Vital Signs ED Triage Vitals  Encounter Vitals Group     BP 06/28/23 0931 (!) 183/94     Systolic BP Percentile --      Diastolic BP Percentile --      Pulse Rate 06/28/23 0931 84     Resp 06/28/23 0931 (!) 24     Temp 06/28/23 0931 98 F (36.7 C)     Temp Source 06/28/23 0931 Oral     SpO2 06/28/23 0931 96 %     Weight --      Height --      Head Circumference --      Peak Flow --      Pain Score 06/28/23 0928 0     Pain Loc --      Pain Education --      Exclude from Growth Chart --    No data found.  Updated Vital Signs BP (!) 153/86 (BP Location: Right  Arm)   Pulse 81   Temp 98 F (36.7 C) (Oral)   Resp (!) 22   SpO2 95%   SPO2 recheck after albuterol nebulizer: 98% room air  Visual Acuity Right Eye Distance:   Left Eye Distance:   Bilateral Distance:    Right Eye Near:   Left Eye Near:    Bilateral  Near:     Physical Exam Vitals and nursing note reviewed.  Constitutional:      General: She is not in acute distress.    Appearance: Normal appearance. She is not ill-appearing or toxic-appearing.  HENT:     Head: Normocephalic and atraumatic.     Right Ear: Tympanic membrane, ear canal and external ear normal.     Left Ear: Tympanic membrane, ear canal and external ear normal.     Nose: No congestion or rhinorrhea.     Mouth/Throat:     Mouth: Mucous membranes are moist.     Pharynx: Oropharynx is clear. Posterior oropharyngeal erythema present. No oropharyngeal exudate.  Eyes:     General: No scleral icterus.    Extraocular Movements: Extraocular movements intact.  Cardiovascular:     Rate and Rhythm: Normal rate and regular rhythm.  Pulmonary:     Effort: Pulmonary effort is normal. Tachypnea present. No respiratory distress.     Breath sounds: Wheezing and rhonchi present. No rales.  Musculoskeletal:     Cervical back: Normal range of motion and neck supple.  Lymphadenopathy:     Cervical: No cervical adenopathy.  Skin:    General: Skin is warm and dry.     Coloration: Skin is not jaundiced or pale.     Findings: No erythema or rash.  Neurological:     Mental Status: She is alert and oriented to person, place, and time.  Psychiatric:        Behavior: Behavior is cooperative.      UC Treatments / Results  Labs (all labs ordered are listed, but only abnormal results are displayed) Labs Reviewed - No data to display  EKG   Radiology DG Chest 2 View Result Date: 06/28/2023 CLINICAL DATA:  Cough for 2 weeks, rhinorrhea EXAM: CHEST - 2 VIEW COMPARISON:  11/02/2018 FINDINGS: Frontal and lateral views of the  chest demonstrate an unremarkable cardiac silhouette. No acute airspace disease, effusion, or pneumothorax. No acute bony abnormality. IMPRESSION: 1. No acute intrathoracic process. Electronically Signed   By: Sharlet Salina M.D.   On: 06/28/2023 10:35    Procedures Procedures (including critical care time)  Medications Ordered in UC Medications  albuterol (PROVENTIL) (2.5 MG/3ML) 0.083% nebulizer solution 2.5 mg (2.5 mg Nebulization Given 06/28/23 1023)  methylPREDNISolone acetate (DEPO-MEDROL) injection 40 mg (40 mg Intramuscular Given 06/28/23 1023)    Initial Impression / Assessment and Plan / UC Course  I have reviewed the triage vital signs and the nursing notes.  Pertinent labs & imaging results that were available during my care of the patient were reviewed by me and considered in my medical decision making (see chart for details).   Patient is well-appearing, afebrile, not tachycardic, oxygenating well on room air.  Patient is mildly tachypneic initially in triage which improves after rest.  She is mildly hypertensive in triage as well.  Hypertension improves after rest.  SpO2 improved after albuterol nebulizer.  1. Subacute cough 2. Acute lower respiratory infection Vitals and exam are stable today SpO2 improved after albuterol nebulizer and Depo-Medrol IM in urgent care today Chest x-ray is negative for acute cardiopulmonary process Symptoms and exam is concerning for acute lower respiratory infection, will treat with doxycycline 100 mg twice daily for 7 days Other supportive care discussed including continue albuterol nebulizer every 4-6 hours scheduled for 48 hours, then use as needed Start cough suppressant medication, guaifenesin 600 mg twice daily Strict ER precautions discussed with patient Follow-up with PCP if  symptoms do not fully improve with treatment  The patient was given the opportunity to ask questions.  All questions answered to their satisfaction.  The patient  is in agreement to this plan.   Final Clinical Impressions(s) / UC Diagnoses   Final diagnoses:  Subacute cough  Acute lower respiratory infection     Discharge Instructions      Chest x-ray today does not show pneumonia.  We are treating you for a lower respiratory infection.  We gave you a breathing treatment and steroid shot today.  Please continue the breathing treatments every 4 hours scheduled at home until Friday, then use them as needed for wheezing, shortness of breath, or cough.  In addition, start taking the oral prednisone tomorrow.  Take the doxycycline as prescribed to treat for lung infection.  Also recommend guaifenesin 600 mg twice daily and Tessalon Perles every 6 hours as needed for coughing.  Make sure you are drinking plenty of fluids.  Recommend follow-up with primary care provider if symptoms do not improve with treatment.  If symptoms worsen, seek care emergently.     ED Prescriptions     Medication Sig Dispense Auth. Provider   albuterol (PROVENTIL) (2.5 MG/3ML) 0.083% nebulizer solution Take 3 mLs (2.5 mg total) by nebulization every 6 (six) hours as needed for wheezing or shortness of breath. 75 mL Cathlean Marseilles A, NP   benzonatate (TESSALON) 100 MG capsule Take 1 capsule (100 mg total) by mouth 3 (three) times daily as needed for cough. Do not take with alcohol or while operating or driving heavy machinery 21 capsule Cathlean Marseilles A, NP   predniSONE (DELTASONE) 20 MG tablet Take 2 tablets (40 mg total) by mouth daily with breakfast for 5 days. 10 tablet Cathlean Marseilles A, NP   doxycycline (VIBRAMYCIN) 100 MG capsule Take 1 capsule (100 mg total) by mouth 2 (two) times daily for 7 days. 14 capsule Valentino Nose, NP      PDMP not reviewed this encounter.   Valentino Nose, NP 06/28/23 1052

## 2023-07-04 ENCOUNTER — Other Ambulatory Visit: Payer: Self-pay | Admitting: Nurse Practitioner
